# Patient Record
Sex: Male | Born: 1935
Health system: Southern US, Community
[De-identification: ages and names within clinical notes are randomized; demographics above are authoritative.]

## PROBLEM LIST (undated history)

## (undated) DIAGNOSIS — E119 Type 2 diabetes mellitus without complications: Secondary | ICD-10-CM

## (undated) DIAGNOSIS — I350 Nonrheumatic aortic (valve) stenosis: Secondary | ICD-10-CM

## (undated) DIAGNOSIS — I4510 Unspecified right bundle-branch block: Principal | ICD-10-CM

## (undated) DIAGNOSIS — M1990 Unspecified osteoarthritis, unspecified site: Secondary | ICD-10-CM

## (undated) DIAGNOSIS — J189 Pneumonia, unspecified organism: Secondary | ICD-10-CM

## (undated) HISTORY — PX: CATARACT EXTRACTION: SUR2

## (undated) HISTORY — PX: HERNIA REPAIR: SHX51

## (undated) HISTORY — PX: TONSILLECTOMY: SUR1361

## (undated) HISTORY — PX: APPENDECTOMY: SHX54

## (undated) HISTORY — PX: LASIK: SHX215

## (undated) SURGERY — Surgical Case
Anesthesia: *Unknown

---

## 2005-03-25 ENCOUNTER — Ambulatory Visit (HOSPITAL_COMMUNITY): Admission: RE | Admit: 2005-03-25 | Discharge: 2005-03-25 | Payer: Self-pay | Admitting: *Deleted

## 2006-07-13 ENCOUNTER — Encounter: Admission: RE | Admit: 2006-07-13 | Discharge: 2006-07-13 | Payer: Self-pay | Admitting: Internal Medicine

## 2006-09-04 ENCOUNTER — Ambulatory Visit (HOSPITAL_COMMUNITY): Admission: RE | Admit: 2006-09-04 | Discharge: 2006-09-05 | Payer: Self-pay | Admitting: General Surgery

## 2007-05-08 IMAGING — US US SCROTUM
1 series · 14 of 25 positions shown · non-contrast
Comparison: none

CLINICAL DATA: Hydrocele.  
SCROTAL ULTRASOUND:
TECHNIQUE: Complete ultrasound examination of the testicles, epididymis, and other scrotal structures was performed.

[Series 1: unknown · 0.12mm/px · 14 of 61 slices shown]
[im 1/61]
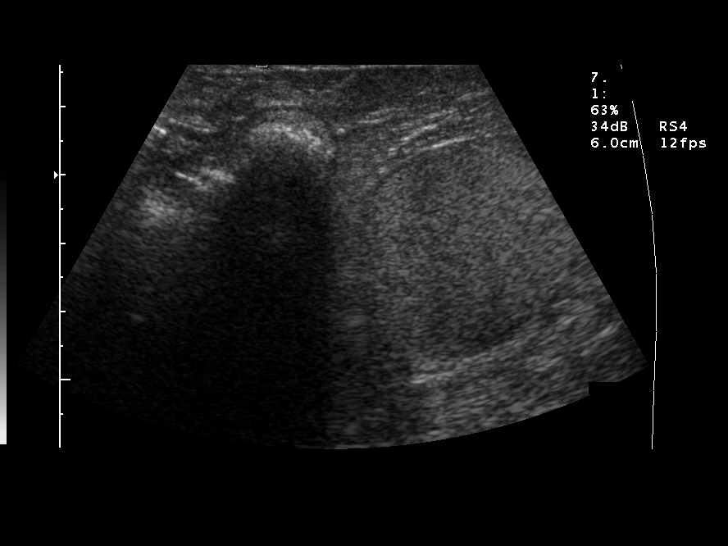
[im 6/61]
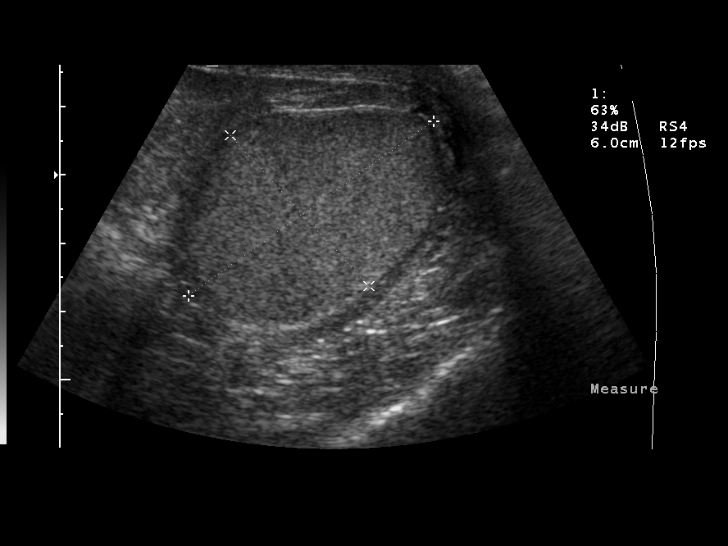
[im 11/61]
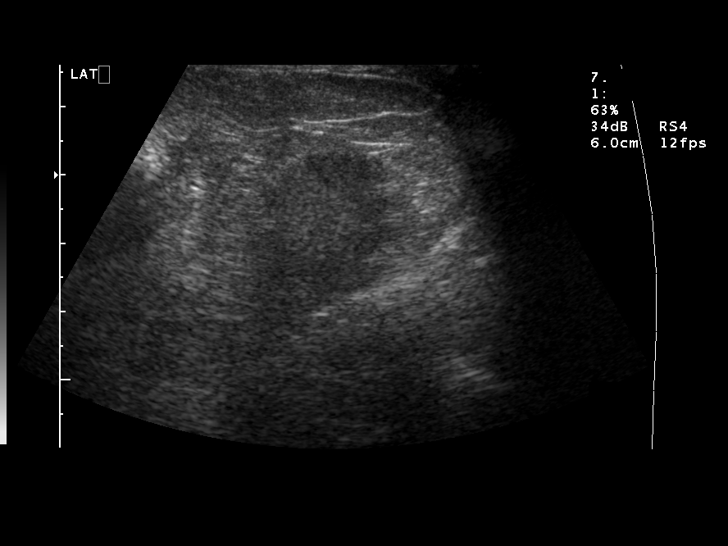
[im 16/61]
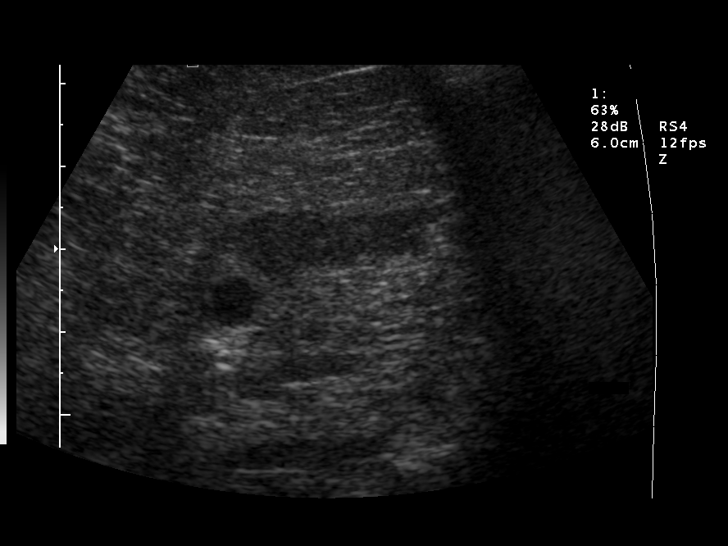
[im 21/61]
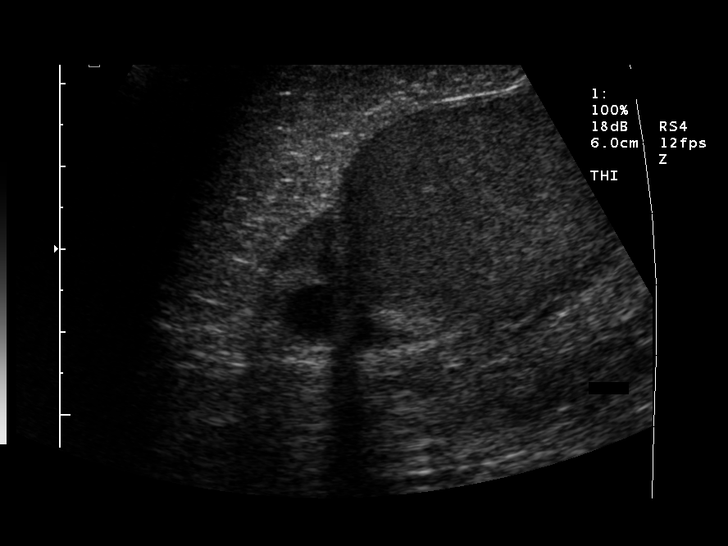
[im 23/61]
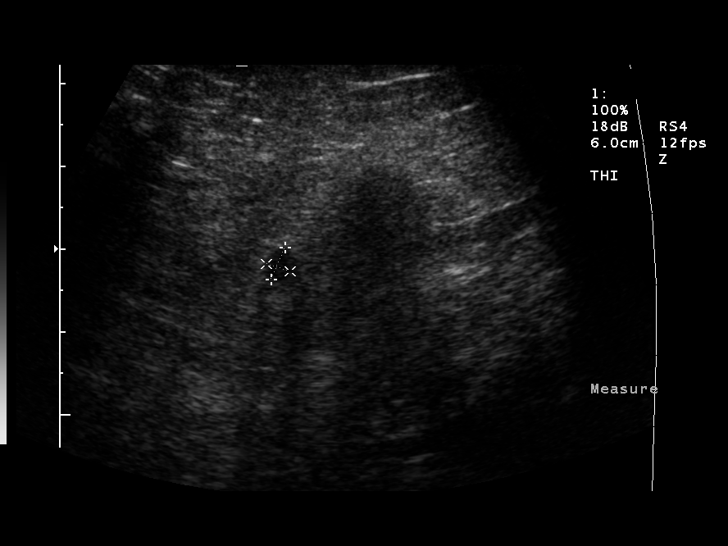
[im 28/61]
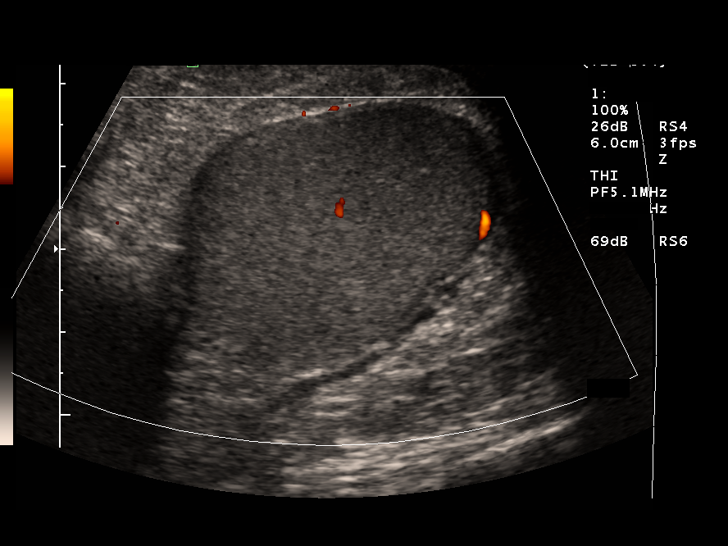
[im 33/61]
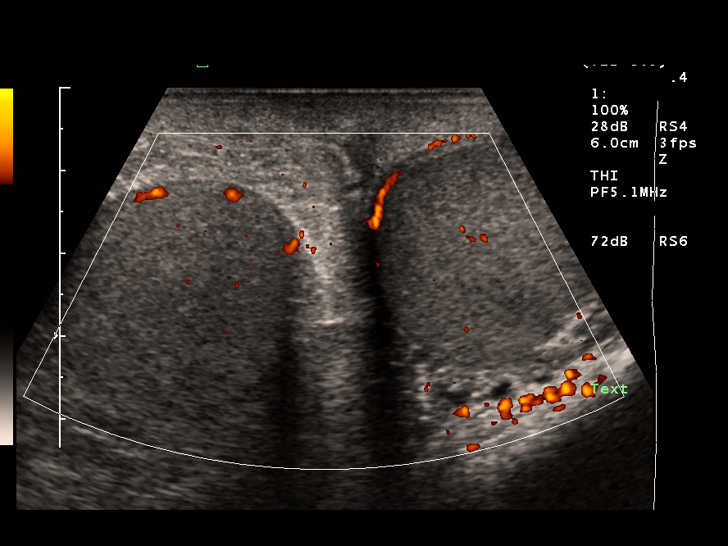
[im 38/61]
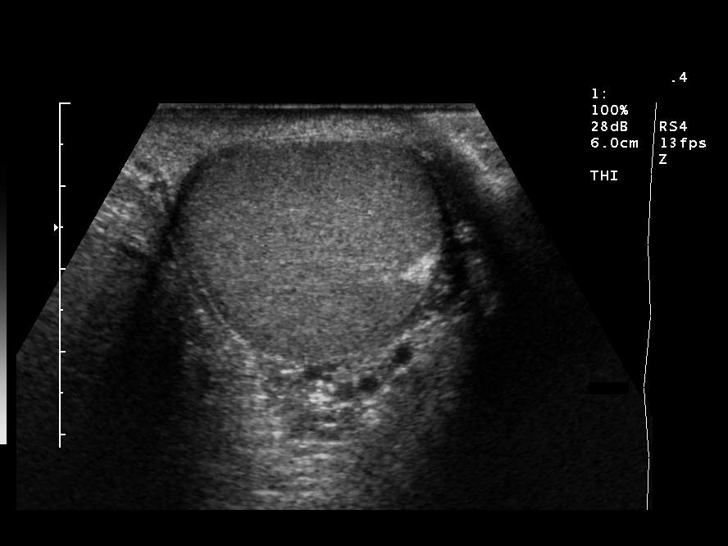
[im 41/61]
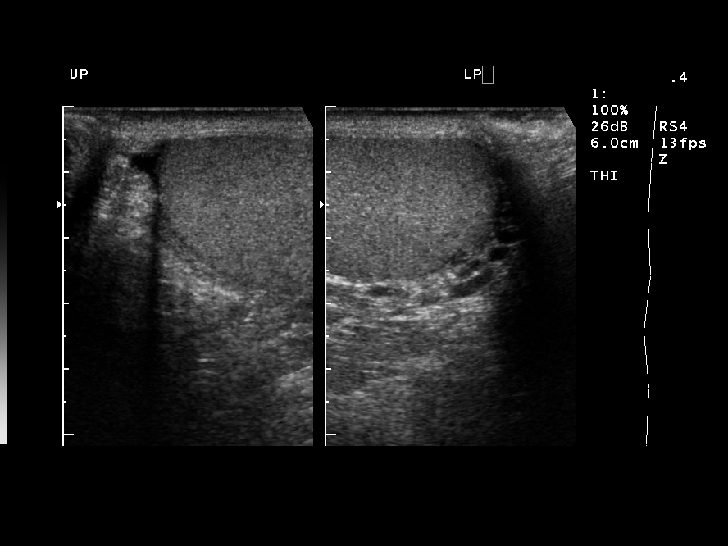
[im 46/61]
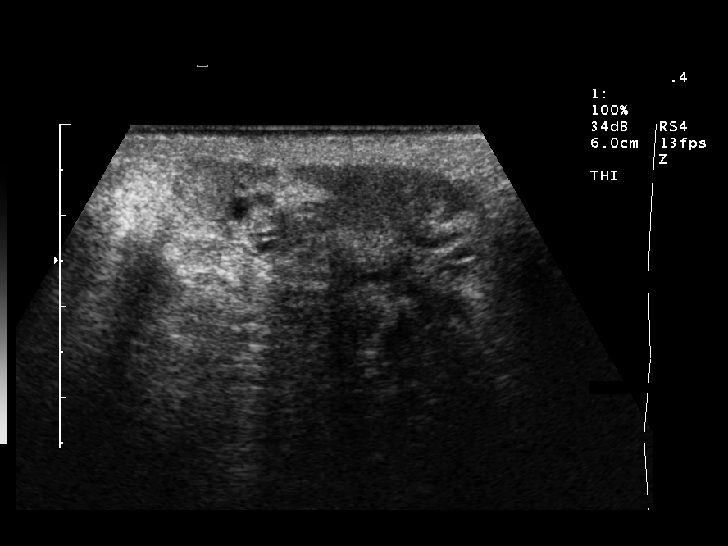
[im 51/61]
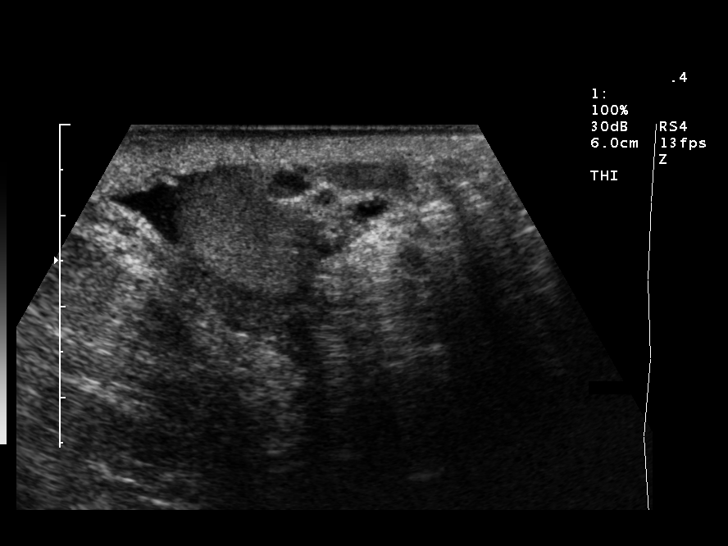
[im 56/61]
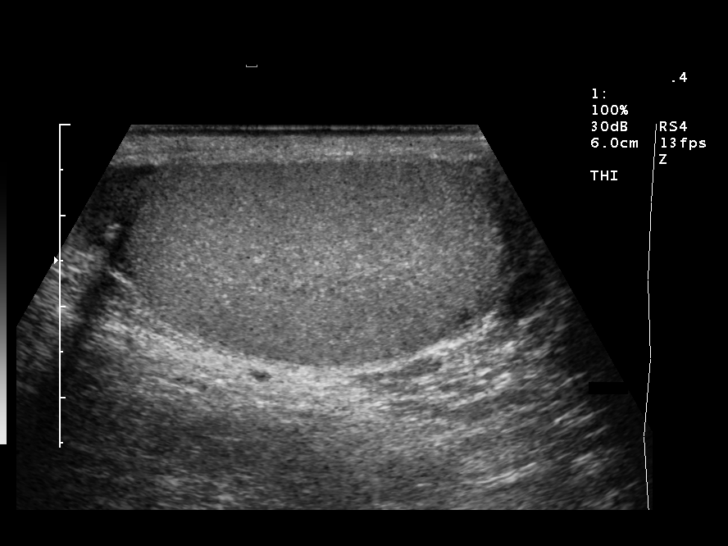
[im 61/61]
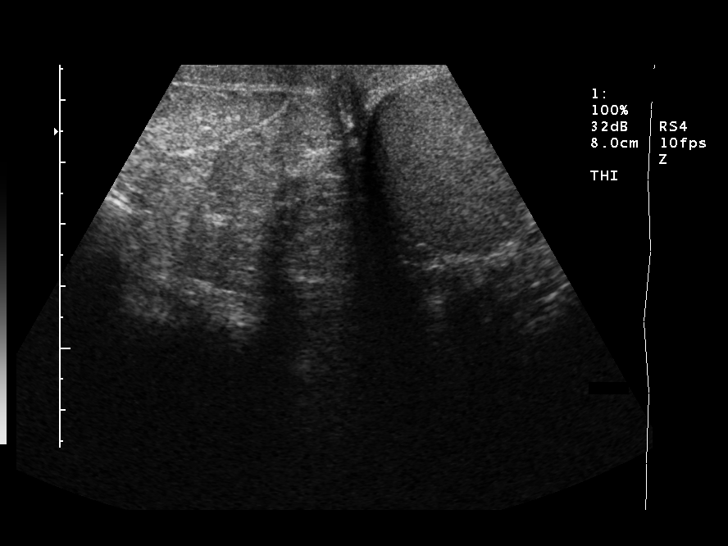

[14 of 25 positions shown; findings below may reference images not displayed]

FINDINGS: The testicles are normal in size and in echogenicity.  There is blood flow to both testicles.  Small epididymal cysts are present bilaterally of no more than 7 mm.  There is a small hydrocele on the left.  No varicocele is seen.  However, there does appear to be echogenicity extending from the inguinal canal toward the right scrotum with peristalsis consistent with herniation of bowel, i.e., right scrotal hernia.
IMPRESSION: 1.  No testicular abnormality.  There is blood flow to both testicles.  
2.  There is, however, bowel extending into the right scrotum consistent with a right scrotal hernia.

## 2011-10-03 ENCOUNTER — Other Ambulatory Visit: Payer: Self-pay | Admitting: Internal Medicine

## 2011-10-03 DIAGNOSIS — R945 Abnormal results of liver function studies: Secondary | ICD-10-CM

## 2011-10-04 ENCOUNTER — Ambulatory Visit
Admission: RE | Admit: 2011-10-04 | Discharge: 2011-10-04 | Disposition: A | Discharge status: Home / Self Care | Payer: Medicare Other | Source: Ambulatory Visit | Attending: Internal Medicine | Admitting: Internal Medicine

## 2011-10-04 DIAGNOSIS — R945 Abnormal results of liver function studies: Secondary | ICD-10-CM

## 2012-07-29 IMAGING — US US ABDOMEN COMPLETE
1 series · 14 of 25 positions shown · non-contrast
Comparison: none

CLINICAL DATA: Abnormal LFTs

COMPLETE ABDOMINAL ULTRASOUND

[Series 1: us abdomen complete · 0.45mm/px · 14 of 74 slices shown]
[im 1/74]
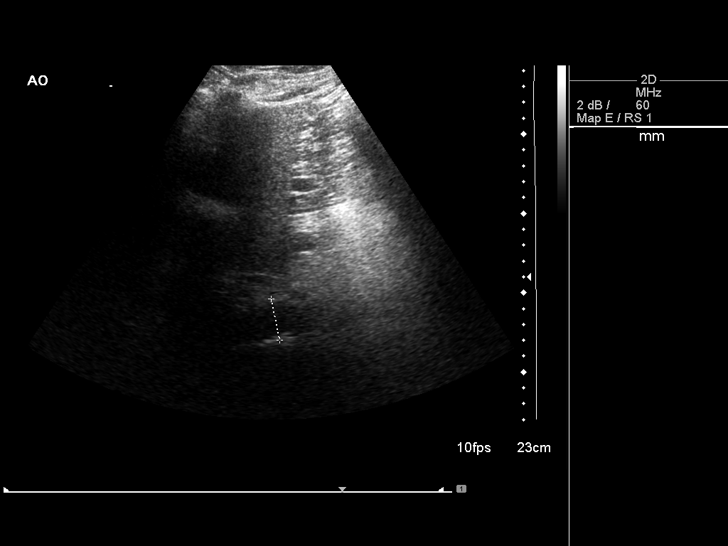
[im 7/74]
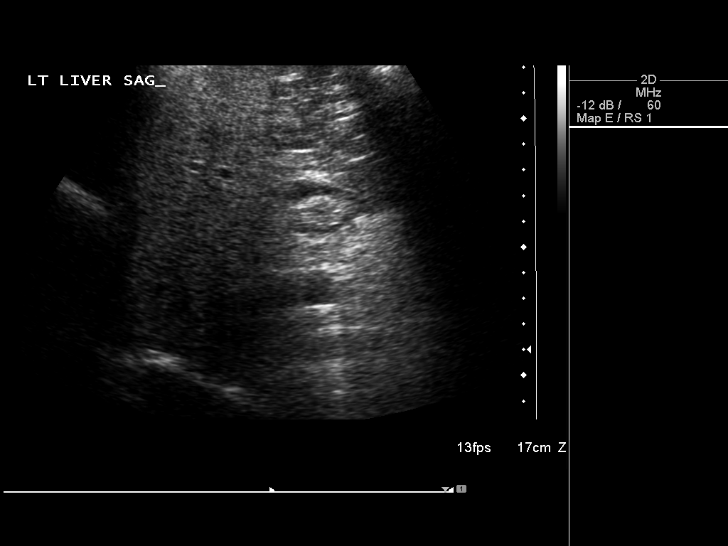
[im 13/74]
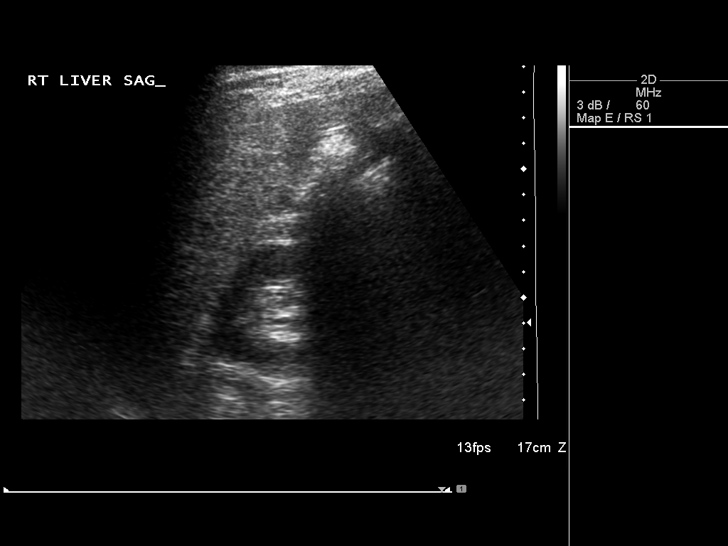
[im 19/74]
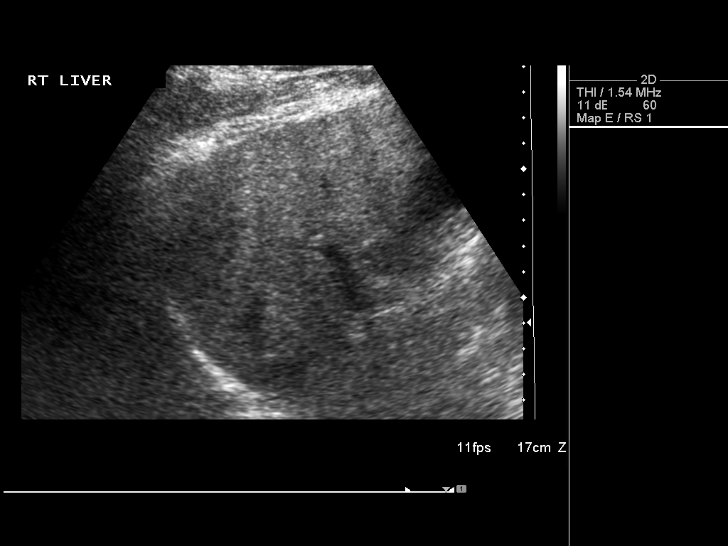
[im 25/74]
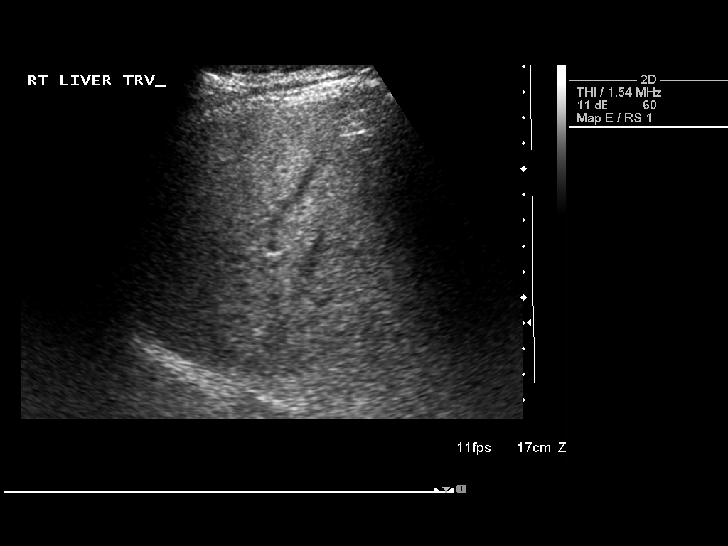
[im 28/74]
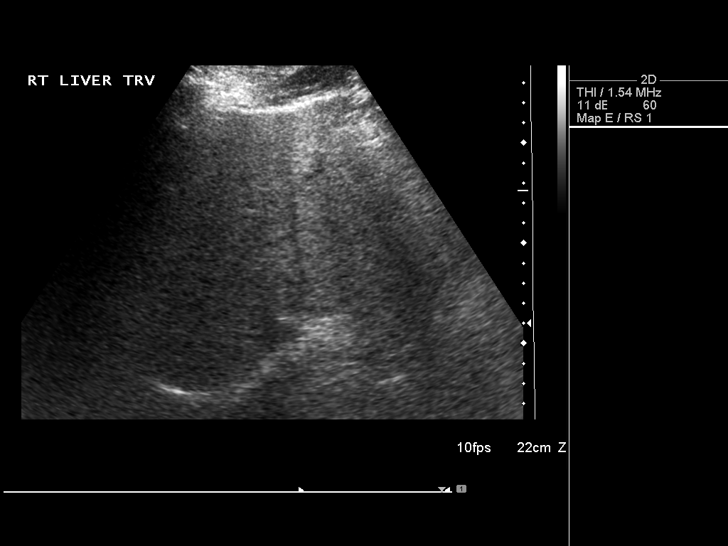
[im 34/74]
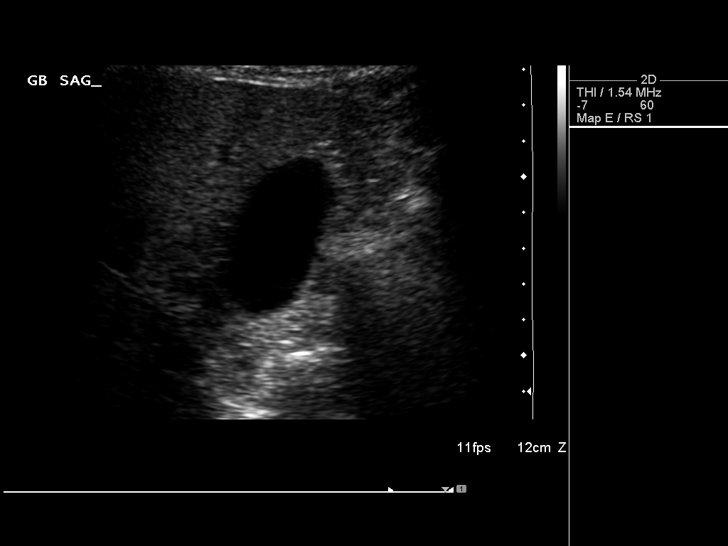
[im 40/74]
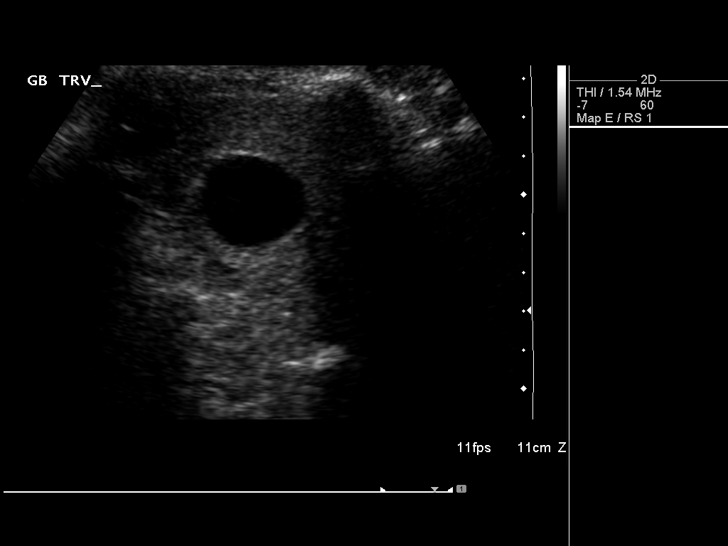
[im 46/74]
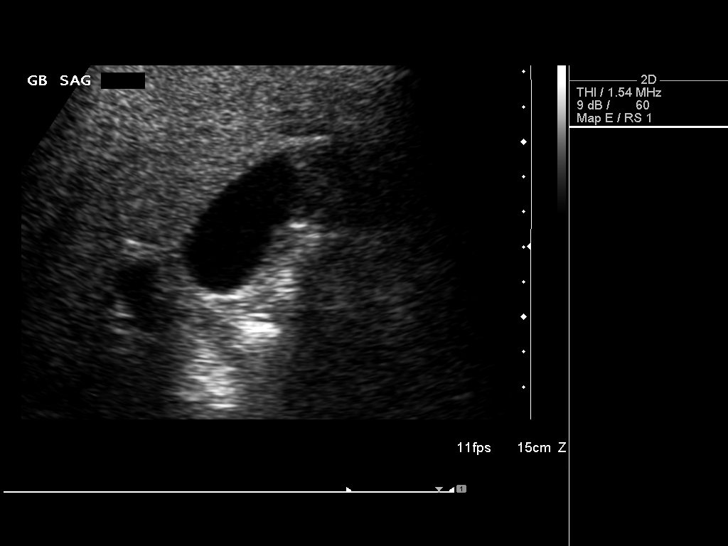
[im 49/74]
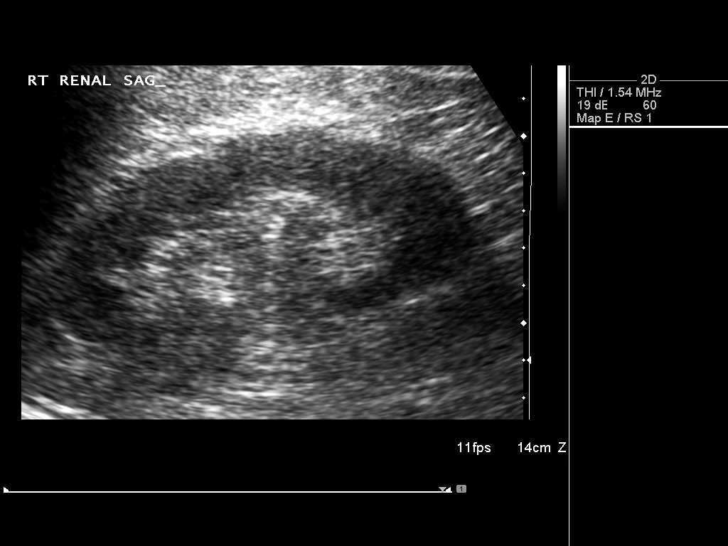
[im 55/74]
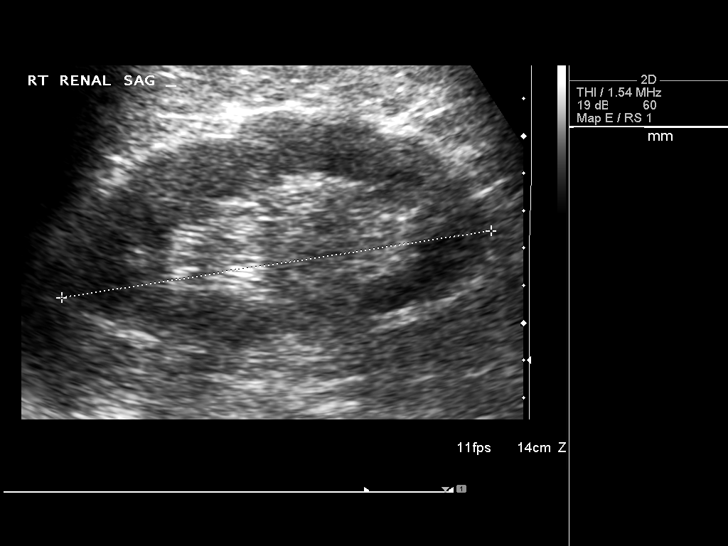
[im 61/74]
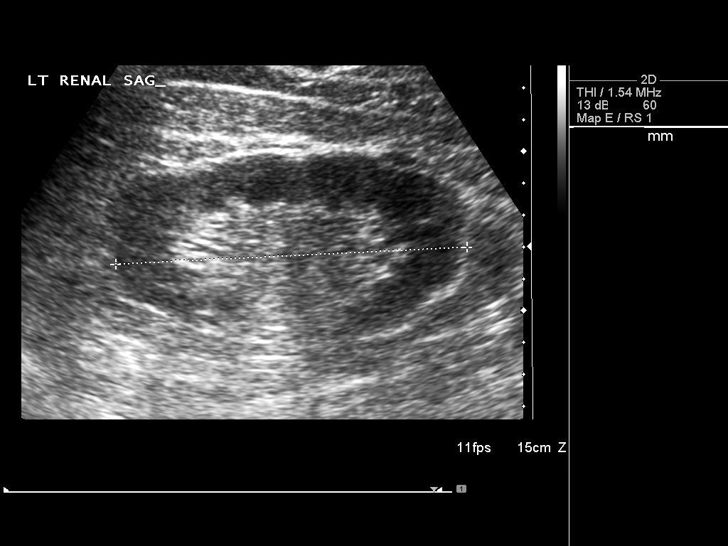
[im 67/74]
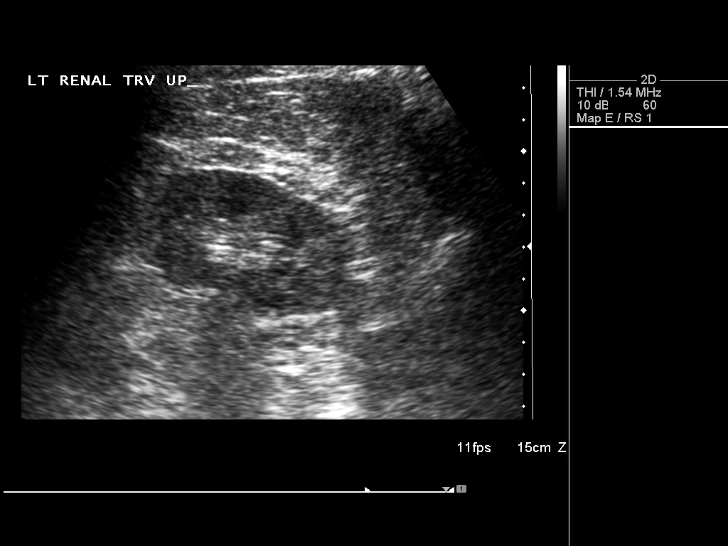
[im 74/74]
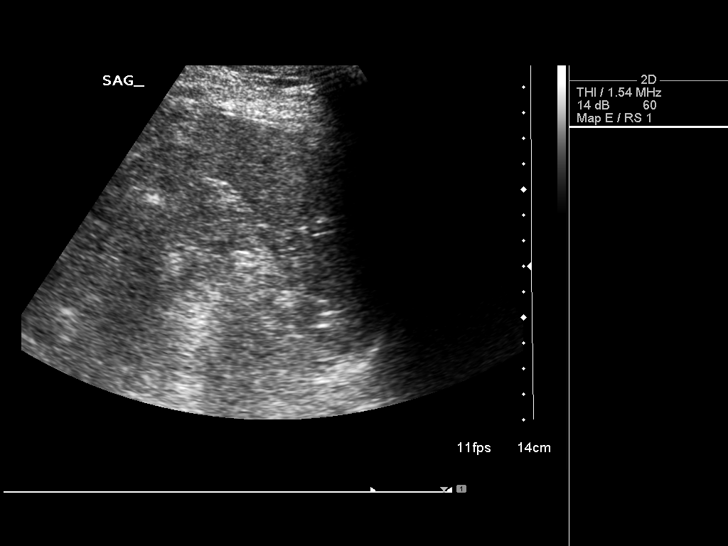

[14 of 25 positions shown; findings below may reference images not displayed]

FINDINGS: Gallbladder:  No gallstones, gallbladder wall thickening, or
pericholecystic fluid.

Common bile duct:  Normal at 4 mm

Liver:  No focal lesion identified.  Within normal limits in
parenchymal echogenicity.

IVC:  Appears normal.

Pancreas:  No focal abnormality seen.

Spleen:  Normal in size and echogenicity.

Right Kidney:  11.6cm in length.  No evidence of hydronephrosis or
stones.

Left Kidney:  12.1cm in length.  No evidence of hydronephrosis or
stones.

Abdominal aorta:  No aneurysm identified.
IMPRESSION: Negative abdominal ultrasound.

## 2013-06-27 ENCOUNTER — Encounter (INDEPENDENT_AMBULATORY_CARE_PROVIDER_SITE_OTHER): Payer: Medicare Other | Admitting: Ophthalmology

## 2013-06-27 DIAGNOSIS — E11319 Type 2 diabetes mellitus with unspecified diabetic retinopathy without macular edema: Secondary | ICD-10-CM

## 2013-06-27 DIAGNOSIS — E1165 Type 2 diabetes mellitus with hyperglycemia: Secondary | ICD-10-CM

## 2013-06-27 DIAGNOSIS — H2510 Age-related nuclear cataract, unspecified eye: Secondary | ICD-10-CM

## 2013-06-27 DIAGNOSIS — H43819 Vitreous degeneration, unspecified eye: Secondary | ICD-10-CM

## 2013-06-27 DIAGNOSIS — H348192 Central retinal vein occlusion, unspecified eye, stable: Secondary | ICD-10-CM

## 2013-07-29 ENCOUNTER — Encounter (INDEPENDENT_AMBULATORY_CARE_PROVIDER_SITE_OTHER): Payer: Medicare Other | Admitting: Ophthalmology

## 2013-07-31 ENCOUNTER — Encounter (INDEPENDENT_AMBULATORY_CARE_PROVIDER_SITE_OTHER): Payer: Medicare Other | Admitting: Ophthalmology

## 2013-07-31 DIAGNOSIS — H43819 Vitreous degeneration, unspecified eye: Secondary | ICD-10-CM

## 2013-07-31 DIAGNOSIS — H348192 Central retinal vein occlusion, unspecified eye, stable: Secondary | ICD-10-CM

## 2013-07-31 DIAGNOSIS — H2510 Age-related nuclear cataract, unspecified eye: Secondary | ICD-10-CM

## 2013-07-31 DIAGNOSIS — E11319 Type 2 diabetes mellitus with unspecified diabetic retinopathy without macular edema: Secondary | ICD-10-CM

## 2013-07-31 DIAGNOSIS — E1139 Type 2 diabetes mellitus with other diabetic ophthalmic complication: Secondary | ICD-10-CM

## 2013-08-28 ENCOUNTER — Encounter (INDEPENDENT_AMBULATORY_CARE_PROVIDER_SITE_OTHER): Payer: Medicare Other | Admitting: Ophthalmology

## 2013-08-28 DIAGNOSIS — E1139 Type 2 diabetes mellitus with other diabetic ophthalmic complication: Secondary | ICD-10-CM

## 2013-08-28 DIAGNOSIS — H348192 Central retinal vein occlusion, unspecified eye, stable: Secondary | ICD-10-CM

## 2013-08-28 DIAGNOSIS — H2510 Age-related nuclear cataract, unspecified eye: Secondary | ICD-10-CM

## 2013-08-28 DIAGNOSIS — E11319 Type 2 diabetes mellitus with unspecified diabetic retinopathy without macular edema: Secondary | ICD-10-CM

## 2013-08-28 DIAGNOSIS — H43819 Vitreous degeneration, unspecified eye: Secondary | ICD-10-CM

## 2013-09-25 ENCOUNTER — Encounter (INDEPENDENT_AMBULATORY_CARE_PROVIDER_SITE_OTHER): Payer: Medicare Other | Admitting: Ophthalmology

## 2013-09-25 DIAGNOSIS — H43819 Vitreous degeneration, unspecified eye: Secondary | ICD-10-CM

## 2013-09-25 DIAGNOSIS — H2510 Age-related nuclear cataract, unspecified eye: Secondary | ICD-10-CM

## 2013-09-25 DIAGNOSIS — E11319 Type 2 diabetes mellitus with unspecified diabetic retinopathy without macular edema: Secondary | ICD-10-CM

## 2013-09-25 DIAGNOSIS — H348192 Central retinal vein occlusion, unspecified eye, stable: Secondary | ICD-10-CM

## 2013-09-25 DIAGNOSIS — E1139 Type 2 diabetes mellitus with other diabetic ophthalmic complication: Secondary | ICD-10-CM

## 2013-10-23 ENCOUNTER — Encounter (INDEPENDENT_AMBULATORY_CARE_PROVIDER_SITE_OTHER): Payer: Medicare Other | Admitting: Ophthalmology

## 2013-10-23 DIAGNOSIS — H43819 Vitreous degeneration, unspecified eye: Secondary | ICD-10-CM

## 2013-10-23 DIAGNOSIS — E11319 Type 2 diabetes mellitus with unspecified diabetic retinopathy without macular edema: Secondary | ICD-10-CM

## 2013-10-23 DIAGNOSIS — H2510 Age-related nuclear cataract, unspecified eye: Secondary | ICD-10-CM

## 2013-10-23 DIAGNOSIS — H348192 Central retinal vein occlusion, unspecified eye, stable: Secondary | ICD-10-CM

## 2013-10-23 DIAGNOSIS — E1139 Type 2 diabetes mellitus with other diabetic ophthalmic complication: Secondary | ICD-10-CM

## 2013-10-23 DIAGNOSIS — I10 Essential (primary) hypertension: Secondary | ICD-10-CM

## 2013-10-23 DIAGNOSIS — H35039 Hypertensive retinopathy, unspecified eye: Secondary | ICD-10-CM

## 2013-11-20 ENCOUNTER — Encounter (INDEPENDENT_AMBULATORY_CARE_PROVIDER_SITE_OTHER): Payer: Medicare Other | Admitting: Ophthalmology

## 2013-11-20 DIAGNOSIS — H43819 Vitreous degeneration, unspecified eye: Secondary | ICD-10-CM

## 2013-11-20 DIAGNOSIS — E11319 Type 2 diabetes mellitus with unspecified diabetic retinopathy without macular edema: Secondary | ICD-10-CM

## 2013-11-20 DIAGNOSIS — H35039 Hypertensive retinopathy, unspecified eye: Secondary | ICD-10-CM

## 2013-11-20 DIAGNOSIS — E1139 Type 2 diabetes mellitus with other diabetic ophthalmic complication: Secondary | ICD-10-CM

## 2013-11-20 DIAGNOSIS — I10 Essential (primary) hypertension: Secondary | ICD-10-CM

## 2013-11-20 DIAGNOSIS — H2510 Age-related nuclear cataract, unspecified eye: Secondary | ICD-10-CM

## 2013-11-20 DIAGNOSIS — H348192 Central retinal vein occlusion, unspecified eye, stable: Secondary | ICD-10-CM

## 2013-12-25 ENCOUNTER — Encounter (INDEPENDENT_AMBULATORY_CARE_PROVIDER_SITE_OTHER): Payer: Medicare Other | Admitting: Ophthalmology

## 2013-12-25 DIAGNOSIS — E11319 Type 2 diabetes mellitus with unspecified diabetic retinopathy without macular edema: Secondary | ICD-10-CM

## 2013-12-25 DIAGNOSIS — I10 Essential (primary) hypertension: Secondary | ICD-10-CM

## 2013-12-25 DIAGNOSIS — E1165 Type 2 diabetes mellitus with hyperglycemia: Secondary | ICD-10-CM

## 2013-12-25 DIAGNOSIS — H43819 Vitreous degeneration, unspecified eye: Secondary | ICD-10-CM

## 2013-12-25 DIAGNOSIS — H348192 Central retinal vein occlusion, unspecified eye, stable: Secondary | ICD-10-CM

## 2013-12-25 DIAGNOSIS — H35039 Hypertensive retinopathy, unspecified eye: Secondary | ICD-10-CM

## 2013-12-25 DIAGNOSIS — H2510 Age-related nuclear cataract, unspecified eye: Secondary | ICD-10-CM

## 2013-12-25 DIAGNOSIS — E1139 Type 2 diabetes mellitus with other diabetic ophthalmic complication: Secondary | ICD-10-CM

## 2014-02-27 ENCOUNTER — Encounter (INDEPENDENT_AMBULATORY_CARE_PROVIDER_SITE_OTHER): Payer: Medicare Other | Admitting: Ophthalmology

## 2014-03-05 ENCOUNTER — Encounter (INDEPENDENT_AMBULATORY_CARE_PROVIDER_SITE_OTHER): Payer: Medicare Other | Admitting: Ophthalmology

## 2014-03-28 ENCOUNTER — Encounter (INDEPENDENT_AMBULATORY_CARE_PROVIDER_SITE_OTHER): Payer: Medicare Other | Admitting: Ophthalmology

## 2014-04-04 ENCOUNTER — Encounter (INDEPENDENT_AMBULATORY_CARE_PROVIDER_SITE_OTHER): Payer: Medicare Other | Admitting: Ophthalmology

## 2014-04-04 DIAGNOSIS — H43819 Vitreous degeneration, unspecified eye: Secondary | ICD-10-CM

## 2014-04-04 DIAGNOSIS — I10 Essential (primary) hypertension: Secondary | ICD-10-CM

## 2014-04-04 DIAGNOSIS — E1165 Type 2 diabetes mellitus with hyperglycemia: Secondary | ICD-10-CM

## 2014-04-04 DIAGNOSIS — E11319 Type 2 diabetes mellitus with unspecified diabetic retinopathy without macular edema: Secondary | ICD-10-CM

## 2014-04-04 DIAGNOSIS — H35039 Hypertensive retinopathy, unspecified eye: Secondary | ICD-10-CM

## 2014-04-04 DIAGNOSIS — H2510 Age-related nuclear cataract, unspecified eye: Secondary | ICD-10-CM

## 2014-04-04 DIAGNOSIS — H348192 Central retinal vein occlusion, unspecified eye, stable: Secondary | ICD-10-CM

## 2014-04-04 DIAGNOSIS — E1139 Type 2 diabetes mellitus with other diabetic ophthalmic complication: Secondary | ICD-10-CM

## 2015-08-03 ENCOUNTER — Encounter (INDEPENDENT_AMBULATORY_CARE_PROVIDER_SITE_OTHER): Payer: PPO | Admitting: Ophthalmology

## 2015-08-03 DIAGNOSIS — E11319 Type 2 diabetes mellitus with unspecified diabetic retinopathy without macular edema: Secondary | ICD-10-CM

## 2015-08-03 DIAGNOSIS — H43813 Vitreous degeneration, bilateral: Secondary | ICD-10-CM

## 2015-08-03 DIAGNOSIS — H34812 Central retinal vein occlusion, left eye: Secondary | ICD-10-CM

## 2015-08-03 DIAGNOSIS — I10 Essential (primary) hypertension: Secondary | ICD-10-CM

## 2015-08-03 DIAGNOSIS — E11329 Type 2 diabetes mellitus with mild nonproliferative diabetic retinopathy without macular edema: Secondary | ICD-10-CM | POA: Diagnosis not present

## 2015-08-03 DIAGNOSIS — H35033 Hypertensive retinopathy, bilateral: Secondary | ICD-10-CM | POA: Diagnosis not present

## 2015-09-02 ENCOUNTER — Encounter (INDEPENDENT_AMBULATORY_CARE_PROVIDER_SITE_OTHER): Payer: PPO | Admitting: Ophthalmology

## 2015-09-02 DIAGNOSIS — E11329 Type 2 diabetes mellitus with mild nonproliferative diabetic retinopathy without macular edema: Secondary | ICD-10-CM | POA: Diagnosis not present

## 2015-09-02 DIAGNOSIS — I10 Essential (primary) hypertension: Secondary | ICD-10-CM

## 2015-09-02 DIAGNOSIS — E11319 Type 2 diabetes mellitus with unspecified diabetic retinopathy without macular edema: Secondary | ICD-10-CM

## 2015-09-02 DIAGNOSIS — H34812 Central retinal vein occlusion, left eye: Secondary | ICD-10-CM | POA: Diagnosis not present

## 2015-09-02 DIAGNOSIS — H35033 Hypertensive retinopathy, bilateral: Secondary | ICD-10-CM

## 2015-09-02 DIAGNOSIS — H43813 Vitreous degeneration, bilateral: Secondary | ICD-10-CM | POA: Diagnosis not present

## 2015-09-30 ENCOUNTER — Encounter (INDEPENDENT_AMBULATORY_CARE_PROVIDER_SITE_OTHER): Payer: PPO | Admitting: Ophthalmology

## 2015-09-30 DIAGNOSIS — H34812 Central retinal vein occlusion, left eye, with macular edema: Secondary | ICD-10-CM | POA: Diagnosis not present

## 2015-09-30 DIAGNOSIS — E113312 Type 2 diabetes mellitus with moderate nonproliferative diabetic retinopathy with macular edema, left eye: Secondary | ICD-10-CM | POA: Diagnosis not present

## 2015-09-30 DIAGNOSIS — E113291 Type 2 diabetes mellitus with mild nonproliferative diabetic retinopathy without macular edema, right eye: Secondary | ICD-10-CM

## 2015-09-30 DIAGNOSIS — E11311 Type 2 diabetes mellitus with unspecified diabetic retinopathy with macular edema: Secondary | ICD-10-CM

## 2015-09-30 DIAGNOSIS — H43813 Vitreous degeneration, bilateral: Secondary | ICD-10-CM

## 2015-10-27 ENCOUNTER — Encounter (INDEPENDENT_AMBULATORY_CARE_PROVIDER_SITE_OTHER): Payer: PPO | Admitting: Ophthalmology

## 2015-10-27 DIAGNOSIS — E113312 Type 2 diabetes mellitus with moderate nonproliferative diabetic retinopathy with macular edema, left eye: Secondary | ICD-10-CM

## 2015-10-27 DIAGNOSIS — H34812 Central retinal vein occlusion, left eye, with macular edema: Secondary | ICD-10-CM

## 2015-10-27 DIAGNOSIS — E113291 Type 2 diabetes mellitus with mild nonproliferative diabetic retinopathy without macular edema, right eye: Secondary | ICD-10-CM | POA: Diagnosis not present

## 2015-10-27 DIAGNOSIS — H43813 Vitreous degeneration, bilateral: Secondary | ICD-10-CM | POA: Diagnosis not present

## 2015-10-27 DIAGNOSIS — E11311 Type 2 diabetes mellitus with unspecified diabetic retinopathy with macular edema: Secondary | ICD-10-CM

## 2015-12-09 ENCOUNTER — Encounter (INDEPENDENT_AMBULATORY_CARE_PROVIDER_SITE_OTHER): Payer: PPO | Admitting: Ophthalmology

## 2015-12-11 ENCOUNTER — Encounter (INDEPENDENT_AMBULATORY_CARE_PROVIDER_SITE_OTHER): Payer: PPO | Admitting: Ophthalmology

## 2015-12-11 DIAGNOSIS — E113312 Type 2 diabetes mellitus with moderate nonproliferative diabetic retinopathy with macular edema, left eye: Secondary | ICD-10-CM | POA: Diagnosis not present

## 2015-12-11 DIAGNOSIS — E113291 Type 2 diabetes mellitus with mild nonproliferative diabetic retinopathy without macular edema, right eye: Secondary | ICD-10-CM | POA: Diagnosis not present

## 2015-12-11 DIAGNOSIS — H34812 Central retinal vein occlusion, left eye, with macular edema: Secondary | ICD-10-CM

## 2015-12-11 DIAGNOSIS — H2511 Age-related nuclear cataract, right eye: Secondary | ICD-10-CM | POA: Diagnosis not present

## 2015-12-11 DIAGNOSIS — H43813 Vitreous degeneration, bilateral: Secondary | ICD-10-CM

## 2015-12-11 DIAGNOSIS — I10 Essential (primary) hypertension: Secondary | ICD-10-CM

## 2015-12-11 DIAGNOSIS — H35033 Hypertensive retinopathy, bilateral: Secondary | ICD-10-CM | POA: Diagnosis not present

## 2015-12-11 DIAGNOSIS — E11311 Type 2 diabetes mellitus with unspecified diabetic retinopathy with macular edema: Secondary | ICD-10-CM | POA: Diagnosis not present

## 2015-12-16 DIAGNOSIS — E119 Type 2 diabetes mellitus without complications: Secondary | ICD-10-CM | POA: Diagnosis not present

## 2015-12-16 DIAGNOSIS — Z7984 Long term (current) use of oral hypoglycemic drugs: Secondary | ICD-10-CM | POA: Diagnosis not present

## 2015-12-21 DIAGNOSIS — E119 Type 2 diabetes mellitus without complications: Secondary | ICD-10-CM | POA: Diagnosis not present

## 2015-12-21 DIAGNOSIS — I10 Essential (primary) hypertension: Secondary | ICD-10-CM | POA: Diagnosis not present

## 2015-12-24 DIAGNOSIS — E7800 Pure hypercholesterolemia, unspecified: Secondary | ICD-10-CM | POA: Diagnosis not present

## 2015-12-24 DIAGNOSIS — E119 Type 2 diabetes mellitus without complications: Secondary | ICD-10-CM | POA: Diagnosis not present

## 2015-12-24 DIAGNOSIS — R011 Cardiac murmur, unspecified: Secondary | ICD-10-CM | POA: Diagnosis not present

## 2016-01-22 ENCOUNTER — Encounter (INDEPENDENT_AMBULATORY_CARE_PROVIDER_SITE_OTHER): Payer: PPO | Admitting: Ophthalmology

## 2016-01-22 DIAGNOSIS — H35033 Hypertensive retinopathy, bilateral: Secondary | ICD-10-CM | POA: Diagnosis not present

## 2016-01-22 DIAGNOSIS — H348122 Central retinal vein occlusion, left eye, stable: Secondary | ICD-10-CM

## 2016-01-22 DIAGNOSIS — H43813 Vitreous degeneration, bilateral: Secondary | ICD-10-CM | POA: Diagnosis not present

## 2016-01-22 DIAGNOSIS — H2511 Age-related nuclear cataract, right eye: Secondary | ICD-10-CM

## 2016-01-22 DIAGNOSIS — E113392 Type 2 diabetes mellitus with moderate nonproliferative diabetic retinopathy without macular edema, left eye: Secondary | ICD-10-CM | POA: Diagnosis not present

## 2016-01-22 DIAGNOSIS — E11319 Type 2 diabetes mellitus with unspecified diabetic retinopathy without macular edema: Secondary | ICD-10-CM | POA: Diagnosis not present

## 2016-01-22 DIAGNOSIS — E113291 Type 2 diabetes mellitus with mild nonproliferative diabetic retinopathy without macular edema, right eye: Secondary | ICD-10-CM

## 2016-01-22 DIAGNOSIS — I10 Essential (primary) hypertension: Secondary | ICD-10-CM | POA: Diagnosis not present

## 2016-03-04 ENCOUNTER — Encounter (INDEPENDENT_AMBULATORY_CARE_PROVIDER_SITE_OTHER): Payer: PPO | Admitting: Ophthalmology

## 2016-03-04 DIAGNOSIS — E11319 Type 2 diabetes mellitus with unspecified diabetic retinopathy without macular edema: Secondary | ICD-10-CM | POA: Diagnosis not present

## 2016-03-04 DIAGNOSIS — H43813 Vitreous degeneration, bilateral: Secondary | ICD-10-CM | POA: Diagnosis not present

## 2016-03-04 DIAGNOSIS — H34813 Central retinal vein occlusion, bilateral, with macular edema: Secondary | ICD-10-CM | POA: Diagnosis not present

## 2016-03-04 DIAGNOSIS — H35033 Hypertensive retinopathy, bilateral: Secondary | ICD-10-CM

## 2016-03-04 DIAGNOSIS — I10 Essential (primary) hypertension: Secondary | ICD-10-CM

## 2016-03-04 DIAGNOSIS — E113291 Type 2 diabetes mellitus with mild nonproliferative diabetic retinopathy without macular edema, right eye: Secondary | ICD-10-CM | POA: Diagnosis not present

## 2016-03-25 DIAGNOSIS — E119 Type 2 diabetes mellitus without complications: Secondary | ICD-10-CM | POA: Diagnosis not present

## 2016-03-25 DIAGNOSIS — E7800 Pure hypercholesterolemia, unspecified: Secondary | ICD-10-CM | POA: Diagnosis not present

## 2016-03-31 DIAGNOSIS — E7800 Pure hypercholesterolemia, unspecified: Secondary | ICD-10-CM | POA: Diagnosis not present

## 2016-03-31 DIAGNOSIS — E119 Type 2 diabetes mellitus without complications: Secondary | ICD-10-CM | POA: Diagnosis not present

## 2016-03-31 DIAGNOSIS — K7689 Other specified diseases of liver: Secondary | ICD-10-CM | POA: Diagnosis not present

## 2016-03-31 DIAGNOSIS — I10 Essential (primary) hypertension: Secondary | ICD-10-CM | POA: Diagnosis not present

## 2016-04-04 ENCOUNTER — Other Ambulatory Visit: Payer: Self-pay | Admitting: Internal Medicine

## 2016-04-04 DIAGNOSIS — R7989 Other specified abnormal findings of blood chemistry: Secondary | ICD-10-CM

## 2016-04-04 DIAGNOSIS — R945 Abnormal results of liver function studies: Secondary | ICD-10-CM

## 2016-04-12 ENCOUNTER — Ambulatory Visit
Admission: RE | Admit: 2016-04-12 | Discharge: 2016-04-12 | Disposition: A | Discharge status: Home / Self Care | Payer: PPO | Source: Ambulatory Visit | Attending: Internal Medicine | Admitting: Internal Medicine

## 2016-04-12 DIAGNOSIS — R7989 Other specified abnormal findings of blood chemistry: Secondary | ICD-10-CM | POA: Diagnosis not present

## 2016-04-12 DIAGNOSIS — R945 Abnormal results of liver function studies: Secondary | ICD-10-CM

## 2016-04-15 ENCOUNTER — Encounter (INDEPENDENT_AMBULATORY_CARE_PROVIDER_SITE_OTHER): Payer: PPO | Admitting: Ophthalmology

## 2016-04-15 DIAGNOSIS — E113291 Type 2 diabetes mellitus with mild nonproliferative diabetic retinopathy without macular edema, right eye: Secondary | ICD-10-CM | POA: Diagnosis not present

## 2016-04-15 DIAGNOSIS — H34812 Central retinal vein occlusion, left eye, with macular edema: Secondary | ICD-10-CM

## 2016-04-15 DIAGNOSIS — H2511 Age-related nuclear cataract, right eye: Secondary | ICD-10-CM

## 2016-04-15 DIAGNOSIS — H35033 Hypertensive retinopathy, bilateral: Secondary | ICD-10-CM | POA: Diagnosis not present

## 2016-04-15 DIAGNOSIS — H43813 Vitreous degeneration, bilateral: Secondary | ICD-10-CM | POA: Diagnosis not present

## 2016-04-15 DIAGNOSIS — E11311 Type 2 diabetes mellitus with unspecified diabetic retinopathy with macular edema: Secondary | ICD-10-CM

## 2016-04-15 DIAGNOSIS — E113312 Type 2 diabetes mellitus with moderate nonproliferative diabetic retinopathy with macular edema, left eye: Secondary | ICD-10-CM

## 2016-04-15 DIAGNOSIS — I10 Essential (primary) hypertension: Secondary | ICD-10-CM

## 2016-06-10 ENCOUNTER — Encounter (INDEPENDENT_AMBULATORY_CARE_PROVIDER_SITE_OTHER): Payer: PPO | Admitting: Ophthalmology

## 2016-06-10 DIAGNOSIS — E113312 Type 2 diabetes mellitus with moderate nonproliferative diabetic retinopathy with macular edema, left eye: Secondary | ICD-10-CM

## 2016-06-10 DIAGNOSIS — I10 Essential (primary) hypertension: Secondary | ICD-10-CM | POA: Diagnosis not present

## 2016-06-10 DIAGNOSIS — E11311 Type 2 diabetes mellitus with unspecified diabetic retinopathy with macular edema: Secondary | ICD-10-CM

## 2016-06-10 DIAGNOSIS — H43813 Vitreous degeneration, bilateral: Secondary | ICD-10-CM

## 2016-06-10 DIAGNOSIS — E113391 Type 2 diabetes mellitus with moderate nonproliferative diabetic retinopathy without macular edema, right eye: Secondary | ICD-10-CM | POA: Diagnosis not present

## 2016-06-10 DIAGNOSIS — H35033 Hypertensive retinopathy, bilateral: Secondary | ICD-10-CM

## 2016-06-10 DIAGNOSIS — H34812 Central retinal vein occlusion, left eye, with macular edema: Secondary | ICD-10-CM

## 2016-07-07 DIAGNOSIS — I10 Essential (primary) hypertension: Secondary | ICD-10-CM | POA: Diagnosis not present

## 2016-07-07 DIAGNOSIS — K7689 Other specified diseases of liver: Secondary | ICD-10-CM | POA: Diagnosis not present

## 2016-07-07 DIAGNOSIS — E119 Type 2 diabetes mellitus without complications: Secondary | ICD-10-CM | POA: Diagnosis not present

## 2016-07-11 DIAGNOSIS — L821 Other seborrheic keratosis: Secondary | ICD-10-CM | POA: Diagnosis not present

## 2016-07-11 DIAGNOSIS — Z125 Encounter for screening for malignant neoplasm of prostate: Secondary | ICD-10-CM | POA: Diagnosis not present

## 2016-07-11 DIAGNOSIS — E119 Type 2 diabetes mellitus without complications: Secondary | ICD-10-CM | POA: Diagnosis not present

## 2016-07-11 DIAGNOSIS — W57XXXA Bitten or stung by nonvenomous insect and other nonvenomous arthropods, initial encounter: Secondary | ICD-10-CM | POA: Diagnosis not present

## 2016-08-04 ENCOUNTER — Encounter (INDEPENDENT_AMBULATORY_CARE_PROVIDER_SITE_OTHER): Payer: PPO | Admitting: Ophthalmology

## 2016-08-04 DIAGNOSIS — H43813 Vitreous degeneration, bilateral: Secondary | ICD-10-CM | POA: Diagnosis not present

## 2016-08-04 DIAGNOSIS — E113312 Type 2 diabetes mellitus with moderate nonproliferative diabetic retinopathy with macular edema, left eye: Secondary | ICD-10-CM | POA: Diagnosis not present

## 2016-08-04 DIAGNOSIS — E113291 Type 2 diabetes mellitus with mild nonproliferative diabetic retinopathy without macular edema, right eye: Secondary | ICD-10-CM | POA: Diagnosis not present

## 2016-08-04 DIAGNOSIS — E11311 Type 2 diabetes mellitus with unspecified diabetic retinopathy with macular edema: Secondary | ICD-10-CM

## 2016-08-04 DIAGNOSIS — H2511 Age-related nuclear cataract, right eye: Secondary | ICD-10-CM

## 2016-08-04 DIAGNOSIS — H34812 Central retinal vein occlusion, left eye, with macular edema: Secondary | ICD-10-CM

## 2016-09-15 ENCOUNTER — Encounter (INDEPENDENT_AMBULATORY_CARE_PROVIDER_SITE_OTHER): Payer: PPO | Admitting: Ophthalmology

## 2016-09-15 DIAGNOSIS — H34812 Central retinal vein occlusion, left eye, with macular edema: Secondary | ICD-10-CM

## 2016-09-15 DIAGNOSIS — H43813 Vitreous degeneration, bilateral: Secondary | ICD-10-CM | POA: Diagnosis not present

## 2016-09-15 DIAGNOSIS — E11311 Type 2 diabetes mellitus with unspecified diabetic retinopathy with macular edema: Secondary | ICD-10-CM

## 2016-09-15 DIAGNOSIS — E113412 Type 2 diabetes mellitus with severe nonproliferative diabetic retinopathy with macular edema, left eye: Secondary | ICD-10-CM | POA: Diagnosis not present

## 2016-09-15 DIAGNOSIS — E113291 Type 2 diabetes mellitus with mild nonproliferative diabetic retinopathy without macular edema, right eye: Secondary | ICD-10-CM | POA: Diagnosis not present

## 2016-11-03 ENCOUNTER — Encounter (INDEPENDENT_AMBULATORY_CARE_PROVIDER_SITE_OTHER): Payer: PPO | Admitting: Ophthalmology

## 2016-11-03 DIAGNOSIS — E11311 Type 2 diabetes mellitus with unspecified diabetic retinopathy with macular edema: Secondary | ICD-10-CM | POA: Diagnosis not present

## 2016-11-03 DIAGNOSIS — H2511 Age-related nuclear cataract, right eye: Secondary | ICD-10-CM | POA: Diagnosis not present

## 2016-11-03 DIAGNOSIS — E113412 Type 2 diabetes mellitus with severe nonproliferative diabetic retinopathy with macular edema, left eye: Secondary | ICD-10-CM

## 2016-11-03 DIAGNOSIS — H34812 Central retinal vein occlusion, left eye, with macular edema: Secondary | ICD-10-CM | POA: Diagnosis not present

## 2016-11-03 DIAGNOSIS — E113291 Type 2 diabetes mellitus with mild nonproliferative diabetic retinopathy without macular edema, right eye: Secondary | ICD-10-CM | POA: Diagnosis not present

## 2016-11-03 DIAGNOSIS — H43813 Vitreous degeneration, bilateral: Secondary | ICD-10-CM

## 2016-11-09 DIAGNOSIS — E119 Type 2 diabetes mellitus without complications: Secondary | ICD-10-CM | POA: Diagnosis not present

## 2016-11-09 DIAGNOSIS — Z125 Encounter for screening for malignant neoplasm of prostate: Secondary | ICD-10-CM | POA: Diagnosis not present

## 2016-11-09 DIAGNOSIS — Z0000 Encounter for general adult medical examination without abnormal findings: Secondary | ICD-10-CM | POA: Diagnosis not present

## 2016-11-15 DIAGNOSIS — I10 Essential (primary) hypertension: Secondary | ICD-10-CM | POA: Diagnosis not present

## 2016-11-15 DIAGNOSIS — E119 Type 2 diabetes mellitus without complications: Secondary | ICD-10-CM | POA: Diagnosis not present

## 2016-11-15 DIAGNOSIS — K7689 Other specified diseases of liver: Secondary | ICD-10-CM | POA: Diagnosis not present

## 2016-11-16 DIAGNOSIS — H401122 Primary open-angle glaucoma, left eye, moderate stage: Secondary | ICD-10-CM | POA: Diagnosis not present

## 2016-11-16 DIAGNOSIS — H401111 Primary open-angle glaucoma, right eye, mild stage: Secondary | ICD-10-CM | POA: Diagnosis not present

## 2016-11-16 DIAGNOSIS — H25811 Combined forms of age-related cataract, right eye: Secondary | ICD-10-CM | POA: Diagnosis not present

## 2016-11-16 DIAGNOSIS — H348122 Central retinal vein occlusion, left eye, stable: Secondary | ICD-10-CM | POA: Diagnosis not present

## 2016-11-16 DIAGNOSIS — Z961 Presence of intraocular lens: Secondary | ICD-10-CM | POA: Diagnosis not present

## 2016-12-15 ENCOUNTER — Other Ambulatory Visit: Payer: Self-pay | Admitting: Internal Medicine

## 2016-12-15 DIAGNOSIS — R945 Abnormal results of liver function studies: Secondary | ICD-10-CM

## 2016-12-23 ENCOUNTER — Other Ambulatory Visit: Payer: PPO

## 2016-12-28 DIAGNOSIS — H401122 Primary open-angle glaucoma, left eye, moderate stage: Secondary | ICD-10-CM | POA: Diagnosis not present

## 2016-12-28 DIAGNOSIS — H25811 Combined forms of age-related cataract, right eye: Secondary | ICD-10-CM | POA: Diagnosis not present

## 2016-12-28 DIAGNOSIS — Z961 Presence of intraocular lens: Secondary | ICD-10-CM | POA: Diagnosis not present

## 2016-12-28 DIAGNOSIS — H401111 Primary open-angle glaucoma, right eye, mild stage: Secondary | ICD-10-CM | POA: Diagnosis not present

## 2016-12-29 ENCOUNTER — Encounter (INDEPENDENT_AMBULATORY_CARE_PROVIDER_SITE_OTHER): Payer: PPO | Admitting: Ophthalmology

## 2016-12-29 DIAGNOSIS — H43813 Vitreous degeneration, bilateral: Secondary | ICD-10-CM

## 2016-12-29 DIAGNOSIS — E113291 Type 2 diabetes mellitus with mild nonproliferative diabetic retinopathy without macular edema, right eye: Secondary | ICD-10-CM | POA: Diagnosis not present

## 2016-12-29 DIAGNOSIS — H34812 Central retinal vein occlusion, left eye, with macular edema: Secondary | ICD-10-CM | POA: Diagnosis not present

## 2016-12-29 DIAGNOSIS — E113312 Type 2 diabetes mellitus with moderate nonproliferative diabetic retinopathy with macular edema, left eye: Secondary | ICD-10-CM | POA: Diagnosis not present

## 2016-12-29 DIAGNOSIS — H2511 Age-related nuclear cataract, right eye: Secondary | ICD-10-CM

## 2016-12-29 DIAGNOSIS — E11311 Type 2 diabetes mellitus with unspecified diabetic retinopathy with macular edema: Secondary | ICD-10-CM

## 2016-12-30 ENCOUNTER — Ambulatory Visit
Admission: RE | Admit: 2016-12-30 | Discharge: 2016-12-30 | Disposition: A | Discharge status: Home / Self Care | Payer: PPO | Source: Ambulatory Visit | Attending: Internal Medicine | Admitting: Internal Medicine

## 2016-12-30 DIAGNOSIS — R945 Abnormal results of liver function studies: Secondary | ICD-10-CM

## 2016-12-30 DIAGNOSIS — R7989 Other specified abnormal findings of blood chemistry: Secondary | ICD-10-CM | POA: Diagnosis not present

## 2017-01-03 DIAGNOSIS — K7689 Other specified diseases of liver: Secondary | ICD-10-CM | POA: Diagnosis not present

## 2017-01-10 DIAGNOSIS — Z0000 Encounter for general adult medical examination without abnormal findings: Secondary | ICD-10-CM | POA: Diagnosis not present

## 2017-01-10 DIAGNOSIS — E119 Type 2 diabetes mellitus without complications: Secondary | ICD-10-CM | POA: Diagnosis not present

## 2017-01-10 DIAGNOSIS — E7800 Pure hypercholesterolemia, unspecified: Secondary | ICD-10-CM | POA: Diagnosis not present

## 2017-01-13 DIAGNOSIS — H401122 Primary open-angle glaucoma, left eye, moderate stage: Secondary | ICD-10-CM | POA: Diagnosis not present

## 2017-02-02 DIAGNOSIS — H5201 Hypermetropia, right eye: Secondary | ICD-10-CM | POA: Diagnosis not present

## 2017-02-05 IMAGING — US US ABDOMEN COMPLETE
1 series · 13 of 25 positions shown · non-contrast
Comparison: Abdominal ultrasound October 04, 2011

CLINICAL DATA: Abnormal liver function studies

EXAM:
ABDOMEN ULTRASOUND COMPLETE

[Series 1: us abdomen complete · 0.28mm/px · 13 of 123 slices shown]
[im 1/123]
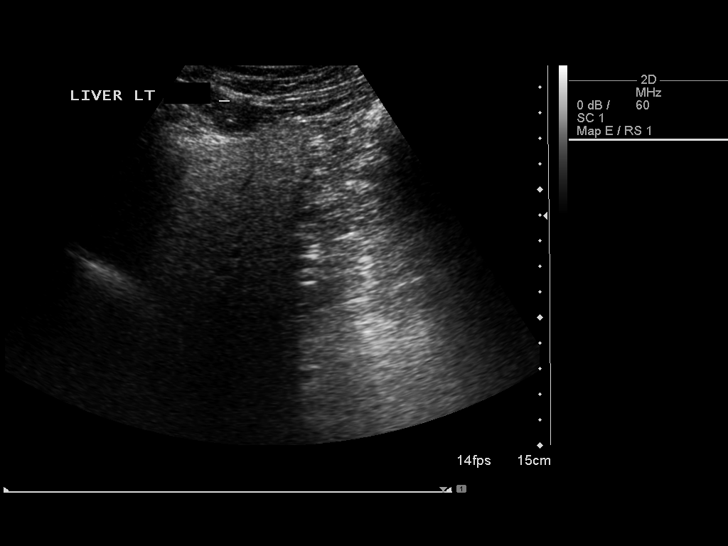
[im 11/123]
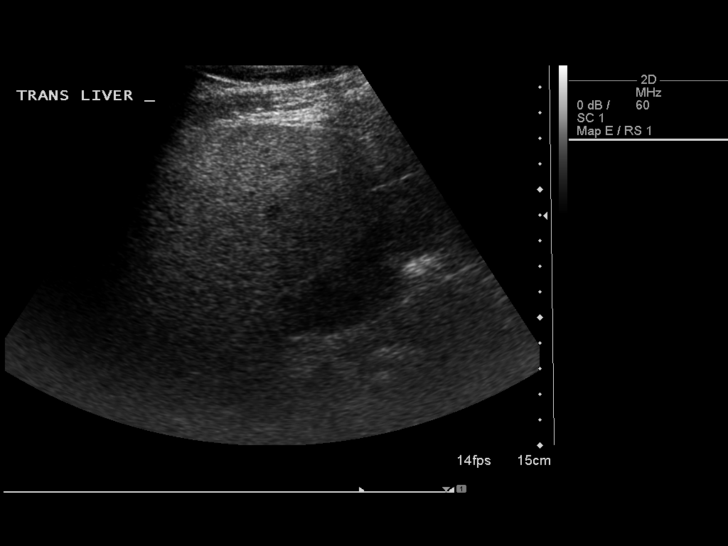
[im 21/123]
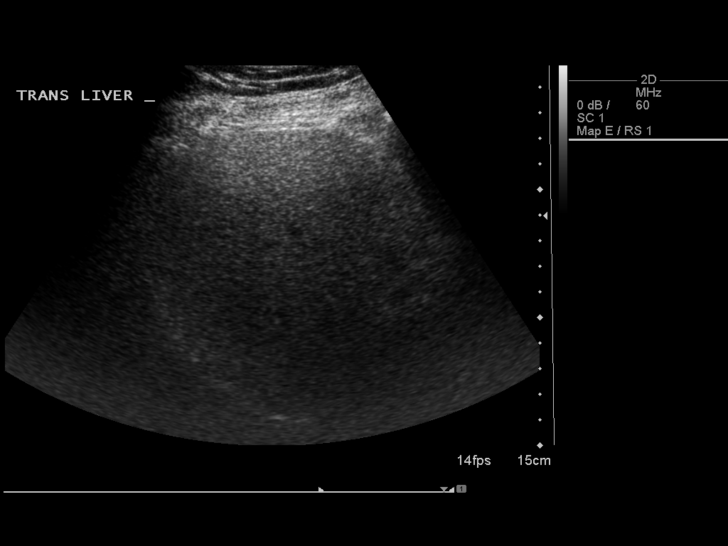
[im 31/123]
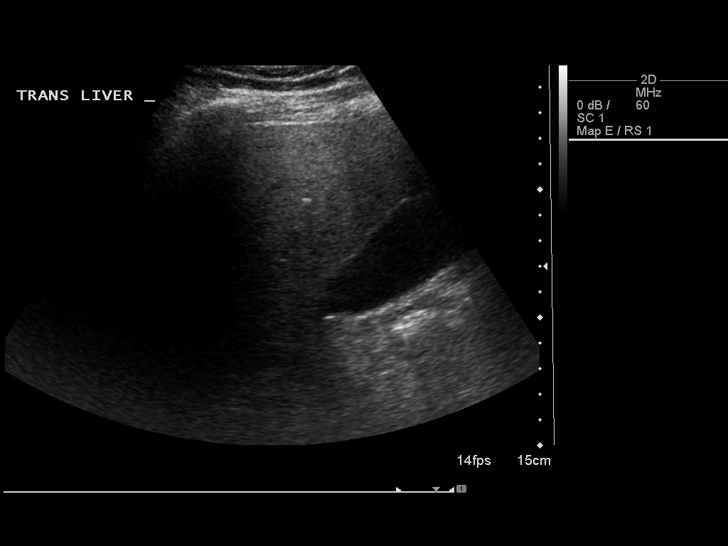
[im 41/123]
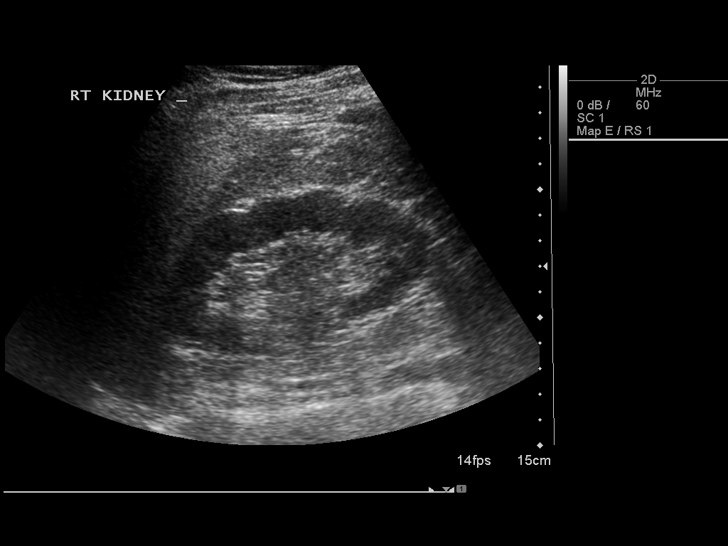
[im 51/123]
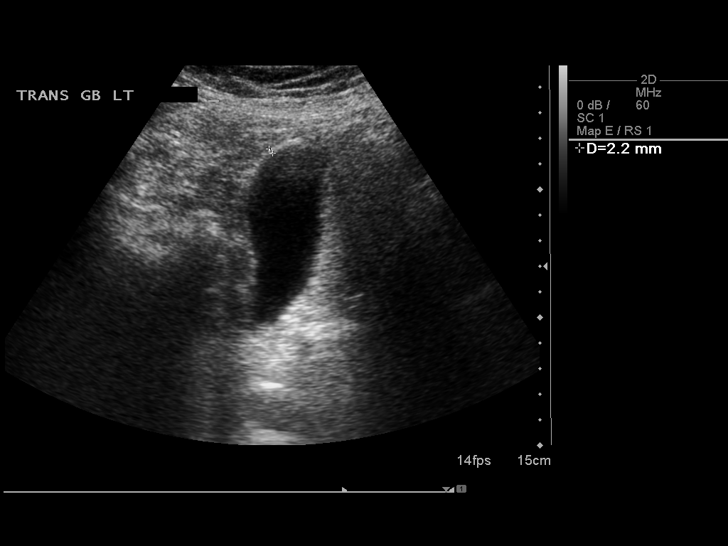
[im 62/123]
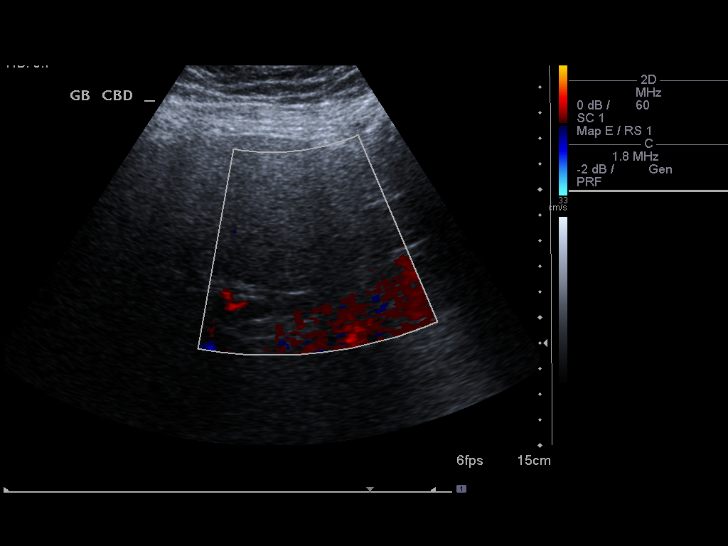
[im 72/123]
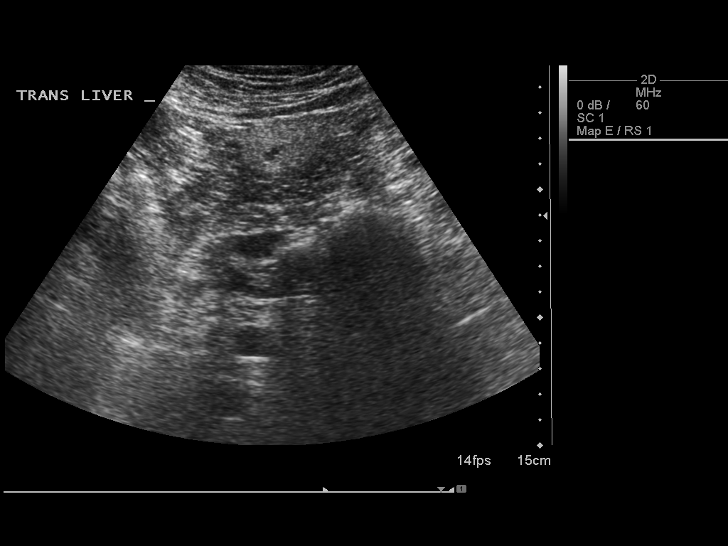
[im 82/123]
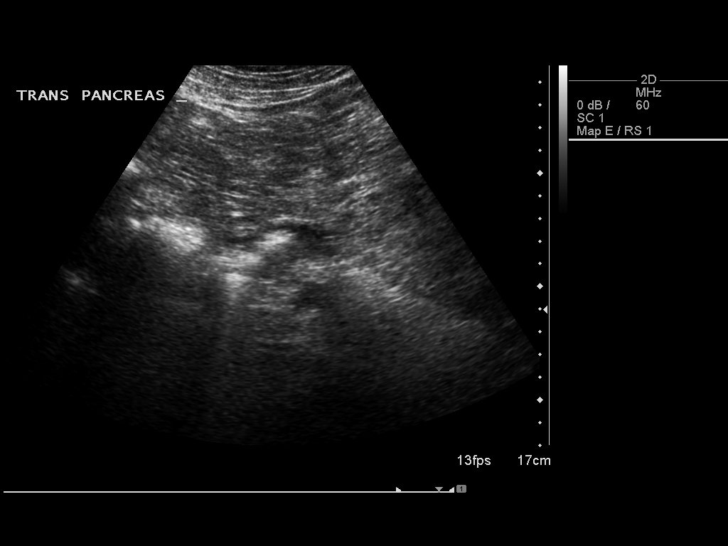
[im 92/123]
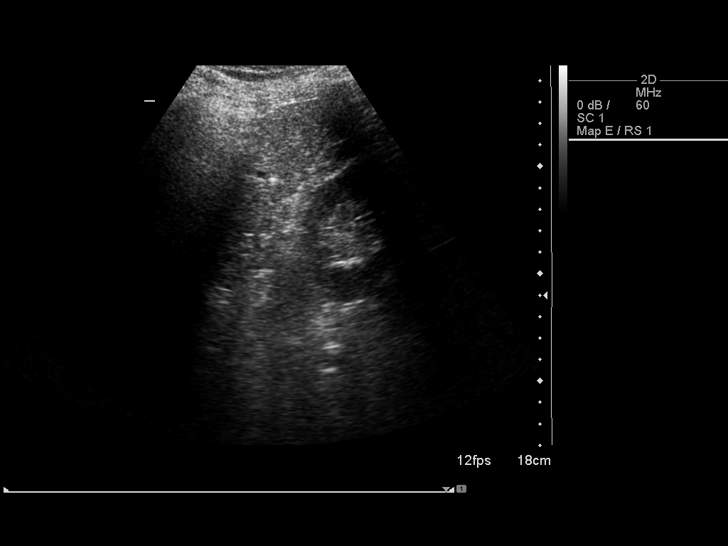
[im 102/123]
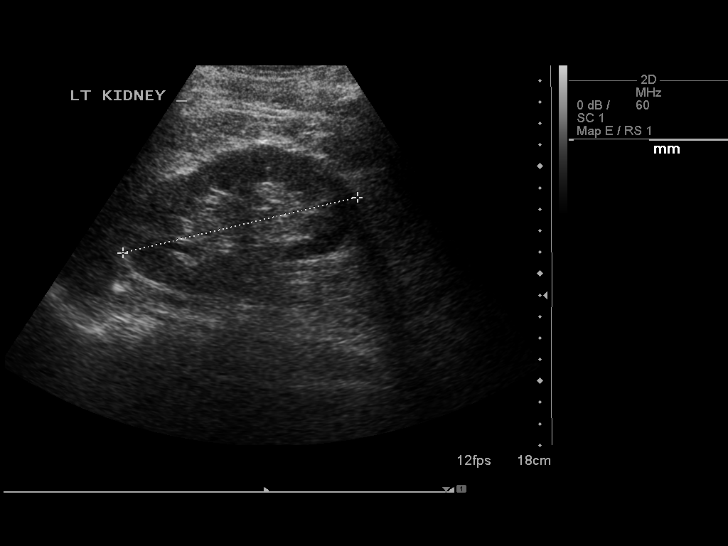
[im 112/123]
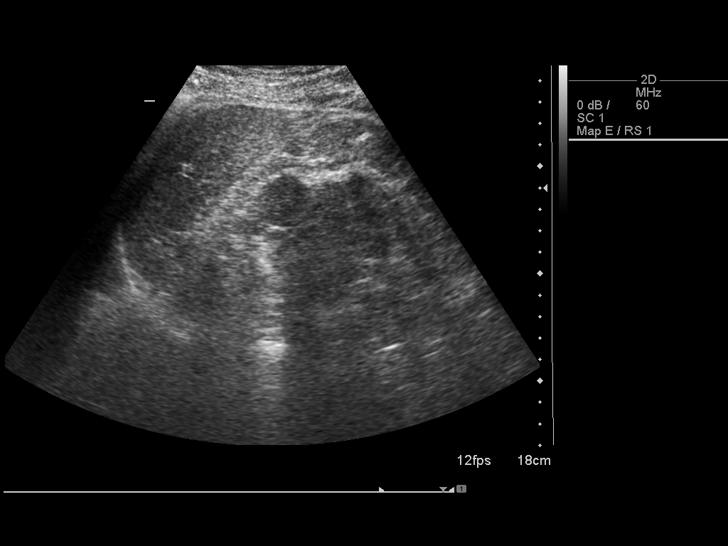
[im 123/123]
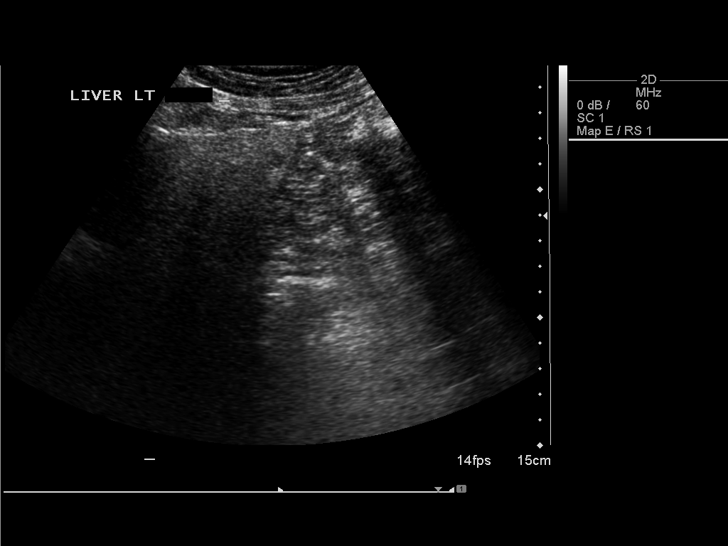

[13 of 25 positions shown; findings below may reference images not displayed]

FINDINGS: Gallbladder: No gallstones or wall thickening visualized. No
sonographic Murphy sign noted by sonographer.

Common bile duct: Diameter: 4.5 mm

Liver: The hepatic echotexture is mildly increased and is
heterogeneous. There is no intrahepatic ductal dilation. The surface
contour of the liver appears normal. There is an 11 mm diameter cyst
in the left hepatic lobe.

IVC: No abnormality visualized.

Pancreas: Bowel gas limits visualization of the pancreas.

Spleen: Size and appearance within normal limits.

Right Kidney: Length: 11.5 cm. Echogenicity within normal limits. No
mass or hydronephrosis visualized.

Left Kidney: Length: 11.2 cm. Echogenicity within normal limits. No
mass or hydronephrosis visualized.

Abdominal aorta: No aneurysm visualized.

Other findings: None.
IMPRESSION: 1. This is a limited study due to bowel gas and the patient's body
habitus and shadowing from ribs. There is heterogeneously increased
echotexture of the liver without a discrete mass. Given the
limitations of the study however hepatic protocol MRI or CT scanning
is recommended.
2. The gallbladder, common bile duct, and visualized portions of the
pancreas are normal.
3. No definite acute abnormality observed elsewhere within the
abdomen.

## 2017-02-09 ENCOUNTER — Encounter (INDEPENDENT_AMBULATORY_CARE_PROVIDER_SITE_OTHER): Payer: PPO | Admitting: Ophthalmology

## 2017-02-09 DIAGNOSIS — H34812 Central retinal vein occlusion, left eye, with macular edema: Secondary | ICD-10-CM

## 2017-02-09 DIAGNOSIS — E113312 Type 2 diabetes mellitus with moderate nonproliferative diabetic retinopathy with macular edema, left eye: Secondary | ICD-10-CM

## 2017-02-09 DIAGNOSIS — E11311 Type 2 diabetes mellitus with unspecified diabetic retinopathy with macular edema: Secondary | ICD-10-CM | POA: Diagnosis not present

## 2017-02-09 DIAGNOSIS — E113291 Type 2 diabetes mellitus with mild nonproliferative diabetic retinopathy without macular edema, right eye: Secondary | ICD-10-CM

## 2017-02-09 DIAGNOSIS — H2511 Age-related nuclear cataract, right eye: Secondary | ICD-10-CM

## 2017-02-09 DIAGNOSIS — H43813 Vitreous degeneration, bilateral: Secondary | ICD-10-CM

## 2017-02-24 DIAGNOSIS — H401122 Primary open-angle glaucoma, left eye, moderate stage: Secondary | ICD-10-CM | POA: Diagnosis not present

## 2017-02-24 DIAGNOSIS — H52211 Irregular astigmatism, right eye: Secondary | ICD-10-CM | POA: Diagnosis not present

## 2017-02-24 DIAGNOSIS — Z961 Presence of intraocular lens: Secondary | ICD-10-CM | POA: Diagnosis not present

## 2017-02-24 DIAGNOSIS — Z9889 Other specified postprocedural states: Secondary | ICD-10-CM | POA: Diagnosis not present

## 2017-02-24 DIAGNOSIS — H25811 Combined forms of age-related cataract, right eye: Secondary | ICD-10-CM | POA: Diagnosis not present

## 2017-02-24 DIAGNOSIS — H401111 Primary open-angle glaucoma, right eye, mild stage: Secondary | ICD-10-CM | POA: Diagnosis not present

## 2017-03-17 ENCOUNTER — Encounter (INDEPENDENT_AMBULATORY_CARE_PROVIDER_SITE_OTHER): Payer: PPO | Admitting: Ophthalmology

## 2017-03-17 DIAGNOSIS — H43813 Vitreous degeneration, bilateral: Secondary | ICD-10-CM | POA: Diagnosis not present

## 2017-03-17 DIAGNOSIS — I10 Essential (primary) hypertension: Secondary | ICD-10-CM | POA: Diagnosis not present

## 2017-03-17 DIAGNOSIS — E113312 Type 2 diabetes mellitus with moderate nonproliferative diabetic retinopathy with macular edema, left eye: Secondary | ICD-10-CM

## 2017-03-17 DIAGNOSIS — H35033 Hypertensive retinopathy, bilateral: Secondary | ICD-10-CM | POA: Diagnosis not present

## 2017-03-17 DIAGNOSIS — E11311 Type 2 diabetes mellitus with unspecified diabetic retinopathy with macular edema: Secondary | ICD-10-CM | POA: Diagnosis not present

## 2017-03-17 DIAGNOSIS — H34812 Central retinal vein occlusion, left eye, with macular edema: Secondary | ICD-10-CM | POA: Diagnosis not present

## 2017-03-17 DIAGNOSIS — E113391 Type 2 diabetes mellitus with moderate nonproliferative diabetic retinopathy without macular edema, right eye: Secondary | ICD-10-CM | POA: Diagnosis not present

## 2017-03-22 ENCOUNTER — Ambulatory Visit (INDEPENDENT_AMBULATORY_CARE_PROVIDER_SITE_OTHER): Payer: PPO | Admitting: Podiatry

## 2017-03-22 ENCOUNTER — Encounter: Payer: Self-pay | Admitting: Podiatry

## 2017-03-22 ENCOUNTER — Ambulatory Visit: Payer: PPO | Admitting: Podiatry

## 2017-03-22 ENCOUNTER — Ambulatory Visit (INDEPENDENT_AMBULATORY_CARE_PROVIDER_SITE_OTHER): Payer: PPO

## 2017-03-22 VITALS — BP 164/92 | HR 87 | Resp 16 | Ht 65.0 in | Wt 167.0 lb

## 2017-03-22 DIAGNOSIS — M79671 Pain in right foot: Secondary | ICD-10-CM | POA: Diagnosis not present

## 2017-03-22 DIAGNOSIS — M2011 Hallux valgus (acquired), right foot: Secondary | ICD-10-CM | POA: Diagnosis not present

## 2017-03-22 DIAGNOSIS — M7751 Other enthesopathy of right foot: Secondary | ICD-10-CM

## 2017-03-22 DIAGNOSIS — M2012 Hallux valgus (acquired), left foot: Secondary | ICD-10-CM | POA: Diagnosis not present

## 2017-03-22 DIAGNOSIS — M779 Enthesopathy, unspecified: Principal | ICD-10-CM

## 2017-03-22 DIAGNOSIS — M778 Other enthesopathies, not elsewhere classified: Secondary | ICD-10-CM

## 2017-03-22 NOTE — Progress Notes (Signed)
   Subjective:    Patient ID: James Avery, male    DOB: Apr 12, 1934, 83 y.o.   MRN: [id number]  HPI    This patient presents today complaining of approximately 1-2 year history of pain in or around his right great toe joint when walking and wearing dress shoes. The symptoms increase with activity and are relieved with rest  and wearing a soft shoe. He also complains of approximately one year history of a sharp pain that persist for approximately 30 seconds in around the right rear foot area that occurs approximately every 1-2 hours and last for approximately 30 seconds. The symptoms occur on and off weightbearing and have been rather persistent without progression over the past year. He denies any other areas of complaint other than the right rear foot area.  Patient is a diabetic for approximately 5 years and denies history of foot amputation, amputation or numbness Patient denies smoking history     Review of Systems  All other systems reviewed and are negative.      Objective:   Physical Exam  Orientated 3  Vascular: No peripheral edema bilaterally DP and PT pulses 2/4 bilaterally Capillary reflex immediate bilaterally  Neurological: Sensation to 10 g monofilament wire intact 5/5 bilaterally Vibratory sensation reactive bilaterally Ankle reflexes reactive bilaterally   Dermatological: No open skin lesions bilaterally Atrophic skin with absent hair growth bilaterally No skin lesions noted bilaterally  Musculoskeletal: HAV bilaterally Tenderness to palpation right first MPJ with mild tenderness on range of motion Palpation right rear foot elicits tenderness along the base of first metatarsal cuneiform area without a palpable tenderness Tenderness over right medial navicular area without palpable lesions Weightbearing patient able to heel off bilaterally and unilaterally without any difficulty  X-ray examination weightbearing right foot dated 03/22/2017  Intact  bony structure without fracture and/or dislocation HAV right Decrease joint space right first MPJ Posterior callus cane heel spur Bone density appears adequate  Radiographic impression: No acute bony abnormality noted in weightbearing x-ray 03/22/2017          Assessment & Plan:    Assessment: Diabetic with satisfactory neurovascular status Symptomatic right HAV Localized neuropathic symptoms right rear foot undetermined origin Right rear foot arthralgia/capsulitis  Plan: I reviewed the results of the exam an x-ray with patient today. We discussed treatment options for the bunion including shoe modification, reduction of activity and padding and possible surgical treatment Also discussed with patient the neuropathic-like symptoms that were occurring the right rear foot without a specific diagnosis at this time. Discussed the application of a bandage to restrict motion of the foot to see if this would reduce some of the symptoms and rear foot. Apply fascial strapping true right foot with instructions to leave on 3-5 days to determine if the right rear foot pain would respond to supportive therapy. If there was a positive response would consider accommodative foot orthotic  Reappoint 2 weeks

## 2017-03-22 NOTE — Patient Instructions (Addendum)
If possible leave  bandage on right foot up to 5 days and then remove.     Diabetes and Foot Care Diabetes may cause you to have problems because of poor blood supply (circulation) to your feet and legs. This may cause the skin on your feet to become thinner, break easier, and heal more slowly. Your skin may become dry, and the skin may peel and crack. You may also have nerve damage in your legs and feet causing decreased feeling in them. You may not notice minor injuries to your feet that could lead to infections or more serious problems. Taking care of your feet is one of the most important things you can do for yourself. Follow these instructions at home:  Wear shoes at all times, even in the house. Do not go barefoot. Bare feet are easily injured.  Check your feet daily for blisters, cuts, and redness. If you cannot see the bottom of your feet, use a mirror or ask someone for help.  Wash your feet with warm water (do not use hot water) and mild soap. Then pat your feet and the areas between your toes until they are completely dry. Do not soak your feet as this can dry your skin.  Apply a moisturizing lotion or petroleum jelly (that does not contain alcohol and is unscented) to the skin on your feet and to dry, brittle toenails. Do not apply lotion between your toes.  Trim your toenails straight across. Do not dig under them or around the cuticle. File the edges of your nails with an emery board or nail file.  Do not cut corns or calluses or try to remove them with medicine.  Wear clean socks or stockings every day. Make sure they are not too tight. Do not wear knee-high stockings since they may decrease blood flow to your legs.  Wear shoes that fit properly and have enough cushioning. To break in new shoes, wear them for just a few hours a day. This prevents you from injuring your feet. Always look in your shoes before you put them on to be sure there are no objects inside.  Do not  cross your legs. This may decrease the blood flow to your feet.  If you find a minor scrape, cut, or break in the skin on your feet, keep it and the skin around it clean and dry. These areas may be cleansed with mild soap and water. Do not cleanse the area with peroxide, alcohol, or iodine.  When you remove an adhesive bandage, be sure not to damage the skin around it.  If you have a wound, look at it several times a day to make sure it is healing.  Do not use heating pads or hot water bottles. They may burn your skin. If you have lost feeling in your feet or legs, you may not know it is happening until it is too late.  Make sure your health care provider performs a complete foot exam at least annually or more often if you have foot problems. Report any cuts, sores, or bruises to your health care provider immediately. Contact a health care provider if:  You have an injury that is not healing.  You have cuts or breaks in the skin.  You have an ingrown nail.  You notice redness on your legs or feet.  You feel burning or tingling in your legs or feet.  You have pain or cramps in your legs and feet.  Your legs  or feet are numb.  Your feet always feel cold. Get help right away if:  There is increasing redness, swelling, or pain in or around a wound.  There is a red line that goes up your leg.  Pus is coming from a wound.  You develop a fever or as directed by your health care provider.  You notice a bad smell coming from an ulcer or wound. This information is not intended to replace advice given to you by your health care provider. Make sure you discuss any questions you have with your health care provider. Document Released: 11/18/2000 Document Revised: 04/28/2016 Document Reviewed: 04/30/2013 Elsevier Interactive Patient Education  2017 [employer].

## 2017-04-04 DIAGNOSIS — H2511 Age-related nuclear cataract, right eye: Secondary | ICD-10-CM | POA: Diagnosis not present

## 2017-04-10 DIAGNOSIS — H25811 Combined forms of age-related cataract, right eye: Secondary | ICD-10-CM | POA: Diagnosis not present

## 2017-04-10 DIAGNOSIS — H2511 Age-related nuclear cataract, right eye: Secondary | ICD-10-CM | POA: Diagnosis not present

## 2017-04-19 ENCOUNTER — Ambulatory Visit (INDEPENDENT_AMBULATORY_CARE_PROVIDER_SITE_OTHER): Payer: PPO | Admitting: Podiatry

## 2017-04-19 DIAGNOSIS — M722 Plantar fascial fibromatosis: Secondary | ICD-10-CM

## 2017-04-19 NOTE — Progress Notes (Signed)
Patient ID: James Avery, male   DOB: [birth year], 83 y.o.   MRN: [id number]   Subjective: This patient presents from the follow-up visit of her 03/22/2017. At that time a fascial strapping was applied. Patient left fascial's trapping on 7 days. He states that the first day he had some increasing pain in them the following days until you remove the Ace wrap he was pain-free. Now when he weightbears he complains of pain in or around his right mid arch midfoot area. The symptoms are primarily occur on weightbearing and relieved with rest. Patient states that he would like to have something that would reduce pressure on his arch   Objective:  Orientated 3  Vascular: No peripheral edema bilaterally DP and PT pulses 2/4 bilaterally Capillary reflex immediate bilaterally  Neurological: Sensation to 10 g monofilament wire intact 5/5 bilaterally Vibratory sensation reactive bilaterally Ankle reflexes reactive bilaterally   Dermatological: No open skin lesions bilaterally Atrophic skin with absent hair growth bilaterally No skin lesions noted bilaterally  Musculoskeletal: HAV bilaterally Tenderness to palpation right first MPJ with mild tenderness on range of motion Palpation right medial plantar fascial band and medial midfoot muscular belly elicits mild discomfort which duplicates patient's discomfort Weightbearing patient able to heel off bilaterally and unilaterally without any difficulty  X-ray examination weightbearing right foot dated 03/22/2017  Intact bony structure without fracture and/or dislocation HAV right Decrease joint space right first MPJ Posterior callus cane heel spur Bone density appears adequate  Radiographic impression: No acute bony abnormality noted in weightbearing x-ray 04  Assessment: Plantar fasciitis medial fascial band mid foot right foot  Plan: Dispensed fasciitis strap for patient to wear an ongoing daily basis in athletic style shoe.  Patient instructed that if the symptoms exacerbate to return for further evaluation. Also, would consider adding on a, native functional orthotic  Reappoint at patient's request

## 2017-04-19 NOTE — Patient Instructions (Signed)
Please wear the plantar fasciitis strap over sock an ongoing continuous basis on the right foot Wear in athletic style shoe Return as needed

## 2017-05-02 ENCOUNTER — Encounter (INDEPENDENT_AMBULATORY_CARE_PROVIDER_SITE_OTHER): Payer: PPO | Admitting: Ophthalmology

## 2017-05-02 DIAGNOSIS — E113312 Type 2 diabetes mellitus with moderate nonproliferative diabetic retinopathy with macular edema, left eye: Secondary | ICD-10-CM | POA: Diagnosis not present

## 2017-05-02 DIAGNOSIS — E11311 Type 2 diabetes mellitus with unspecified diabetic retinopathy with macular edema: Secondary | ICD-10-CM | POA: Diagnosis not present

## 2017-05-02 DIAGNOSIS — H43813 Vitreous degeneration, bilateral: Secondary | ICD-10-CM | POA: Diagnosis not present

## 2017-05-02 DIAGNOSIS — H34812 Central retinal vein occlusion, left eye, with macular edema: Secondary | ICD-10-CM | POA: Diagnosis not present

## 2017-05-02 DIAGNOSIS — H2512 Age-related nuclear cataract, left eye: Secondary | ICD-10-CM

## 2017-05-02 DIAGNOSIS — E113291 Type 2 diabetes mellitus with mild nonproliferative diabetic retinopathy without macular edema, right eye: Secondary | ICD-10-CM | POA: Diagnosis not present

## 2017-07-11 ENCOUNTER — Encounter (INDEPENDENT_AMBULATORY_CARE_PROVIDER_SITE_OTHER): Payer: PPO | Admitting: Ophthalmology

## 2017-07-11 DIAGNOSIS — H43813 Vitreous degeneration, bilateral: Secondary | ICD-10-CM | POA: Diagnosis not present

## 2017-07-11 DIAGNOSIS — E113391 Type 2 diabetes mellitus with moderate nonproliferative diabetic retinopathy without macular edema, right eye: Secondary | ICD-10-CM | POA: Diagnosis not present

## 2017-07-11 DIAGNOSIS — I10 Essential (primary) hypertension: Secondary | ICD-10-CM

## 2017-07-11 DIAGNOSIS — H34812 Central retinal vein occlusion, left eye, with macular edema: Secondary | ICD-10-CM

## 2017-07-11 DIAGNOSIS — H35033 Hypertensive retinopathy, bilateral: Secondary | ICD-10-CM

## 2017-07-11 DIAGNOSIS — E113412 Type 2 diabetes mellitus with severe nonproliferative diabetic retinopathy with macular edema, left eye: Secondary | ICD-10-CM

## 2017-07-11 DIAGNOSIS — E11311 Type 2 diabetes mellitus with unspecified diabetic retinopathy with macular edema: Secondary | ICD-10-CM

## 2017-07-28 DIAGNOSIS — E7800 Pure hypercholesterolemia, unspecified: Secondary | ICD-10-CM | POA: Diagnosis not present

## 2017-07-28 DIAGNOSIS — E119 Type 2 diabetes mellitus without complications: Secondary | ICD-10-CM | POA: Diagnosis not present

## 2017-07-28 DIAGNOSIS — Z0000 Encounter for general adult medical examination without abnormal findings: Secondary | ICD-10-CM | POA: Diagnosis not present

## 2017-08-04 DIAGNOSIS — Z0000 Encounter for general adult medical examination without abnormal findings: Secondary | ICD-10-CM | POA: Diagnosis not present

## 2017-08-04 DIAGNOSIS — E119 Type 2 diabetes mellitus without complications: Secondary | ICD-10-CM | POA: Diagnosis not present

## 2017-08-15 ENCOUNTER — Encounter (INDEPENDENT_AMBULATORY_CARE_PROVIDER_SITE_OTHER): Payer: PPO | Admitting: Ophthalmology

## 2017-08-15 DIAGNOSIS — E113412 Type 2 diabetes mellitus with severe nonproliferative diabetic retinopathy with macular edema, left eye: Secondary | ICD-10-CM | POA: Diagnosis not present

## 2017-08-15 DIAGNOSIS — H43813 Vitreous degeneration, bilateral: Secondary | ICD-10-CM | POA: Diagnosis not present

## 2017-08-15 DIAGNOSIS — H35033 Hypertensive retinopathy, bilateral: Secondary | ICD-10-CM | POA: Diagnosis not present

## 2017-08-15 DIAGNOSIS — I10 Essential (primary) hypertension: Secondary | ICD-10-CM | POA: Diagnosis not present

## 2017-08-15 DIAGNOSIS — H34812 Central retinal vein occlusion, left eye, with macular edema: Secondary | ICD-10-CM | POA: Diagnosis not present

## 2017-08-15 DIAGNOSIS — E11311 Type 2 diabetes mellitus with unspecified diabetic retinopathy with macular edema: Secondary | ICD-10-CM

## 2017-08-15 DIAGNOSIS — E113391 Type 2 diabetes mellitus with moderate nonproliferative diabetic retinopathy without macular edema, right eye: Secondary | ICD-10-CM

## 2017-09-14 ENCOUNTER — Encounter (INDEPENDENT_AMBULATORY_CARE_PROVIDER_SITE_OTHER): Payer: PPO | Admitting: Ophthalmology

## 2017-09-14 DIAGNOSIS — H43813 Vitreous degeneration, bilateral: Secondary | ICD-10-CM

## 2017-09-14 DIAGNOSIS — H35033 Hypertensive retinopathy, bilateral: Secondary | ICD-10-CM | POA: Diagnosis not present

## 2017-09-14 DIAGNOSIS — E11311 Type 2 diabetes mellitus with unspecified diabetic retinopathy with macular edema: Secondary | ICD-10-CM

## 2017-09-14 DIAGNOSIS — E113392 Type 2 diabetes mellitus with moderate nonproliferative diabetic retinopathy without macular edema, left eye: Secondary | ICD-10-CM | POA: Diagnosis not present

## 2017-09-14 DIAGNOSIS — I10 Essential (primary) hypertension: Secondary | ICD-10-CM

## 2017-09-14 DIAGNOSIS — E113311 Type 2 diabetes mellitus with moderate nonproliferative diabetic retinopathy with macular edema, right eye: Secondary | ICD-10-CM | POA: Diagnosis not present

## 2017-09-14 DIAGNOSIS — H34812 Central retinal vein occlusion, left eye, with macular edema: Secondary | ICD-10-CM | POA: Diagnosis not present

## 2017-10-12 ENCOUNTER — Encounter (INDEPENDENT_AMBULATORY_CARE_PROVIDER_SITE_OTHER): Payer: PPO | Admitting: Ophthalmology

## 2017-10-12 DIAGNOSIS — E113291 Type 2 diabetes mellitus with mild nonproliferative diabetic retinopathy without macular edema, right eye: Secondary | ICD-10-CM | POA: Diagnosis not present

## 2017-10-12 DIAGNOSIS — E113312 Type 2 diabetes mellitus with moderate nonproliferative diabetic retinopathy with macular edema, left eye: Secondary | ICD-10-CM

## 2017-10-12 DIAGNOSIS — I10 Essential (primary) hypertension: Secondary | ICD-10-CM

## 2017-10-12 DIAGNOSIS — E11311 Type 2 diabetes mellitus with unspecified diabetic retinopathy with macular edema: Secondary | ICD-10-CM | POA: Diagnosis not present

## 2017-10-12 DIAGNOSIS — H43813 Vitreous degeneration, bilateral: Secondary | ICD-10-CM

## 2017-10-12 DIAGNOSIS — H35033 Hypertensive retinopathy, bilateral: Secondary | ICD-10-CM | POA: Diagnosis not present

## 2017-10-12 DIAGNOSIS — H34812 Central retinal vein occlusion, left eye, with macular edema: Secondary | ICD-10-CM | POA: Diagnosis not present

## 2017-10-24 DIAGNOSIS — H401122 Primary open-angle glaucoma, left eye, moderate stage: Secondary | ICD-10-CM | POA: Diagnosis not present

## 2017-10-24 DIAGNOSIS — H26491 Other secondary cataract, right eye: Secondary | ICD-10-CM | POA: Diagnosis not present

## 2017-10-24 DIAGNOSIS — Z961 Presence of intraocular lens: Secondary | ICD-10-CM | POA: Diagnosis not present

## 2017-10-24 DIAGNOSIS — H401111 Primary open-angle glaucoma, right eye, mild stage: Secondary | ICD-10-CM | POA: Diagnosis not present

## 2017-10-30 DIAGNOSIS — E119 Type 2 diabetes mellitus without complications: Secondary | ICD-10-CM | POA: Diagnosis not present

## 2017-11-06 DIAGNOSIS — E7800 Pure hypercholesterolemia, unspecified: Secondary | ICD-10-CM | POA: Diagnosis not present

## 2017-11-06 DIAGNOSIS — E119 Type 2 diabetes mellitus without complications: Secondary | ICD-10-CM | POA: Diagnosis not present

## 2017-11-09 ENCOUNTER — Encounter (INDEPENDENT_AMBULATORY_CARE_PROVIDER_SITE_OTHER): Payer: PPO | Admitting: Ophthalmology

## 2017-11-09 DIAGNOSIS — H43813 Vitreous degeneration, bilateral: Secondary | ICD-10-CM

## 2017-11-09 DIAGNOSIS — I10 Essential (primary) hypertension: Secondary | ICD-10-CM

## 2017-11-09 DIAGNOSIS — E11311 Type 2 diabetes mellitus with unspecified diabetic retinopathy with macular edema: Secondary | ICD-10-CM | POA: Diagnosis not present

## 2017-11-09 DIAGNOSIS — H35033 Hypertensive retinopathy, bilateral: Secondary | ICD-10-CM

## 2017-11-09 DIAGNOSIS — H34812 Central retinal vein occlusion, left eye, with macular edema: Secondary | ICD-10-CM | POA: Diagnosis not present

## 2017-11-09 DIAGNOSIS — E113311 Type 2 diabetes mellitus with moderate nonproliferative diabetic retinopathy with macular edema, right eye: Secondary | ICD-10-CM | POA: Diagnosis not present

## 2017-11-09 DIAGNOSIS — E113392 Type 2 diabetes mellitus with moderate nonproliferative diabetic retinopathy without macular edema, left eye: Secondary | ICD-10-CM | POA: Diagnosis not present

## 2017-12-22 ENCOUNTER — Encounter (INDEPENDENT_AMBULATORY_CARE_PROVIDER_SITE_OTHER): Payer: PPO | Admitting: Ophthalmology

## 2017-12-22 DIAGNOSIS — E113312 Type 2 diabetes mellitus with moderate nonproliferative diabetic retinopathy with macular edema, left eye: Secondary | ICD-10-CM | POA: Diagnosis not present

## 2017-12-22 DIAGNOSIS — E113291 Type 2 diabetes mellitus with mild nonproliferative diabetic retinopathy without macular edema, right eye: Secondary | ICD-10-CM | POA: Diagnosis not present

## 2017-12-22 DIAGNOSIS — H34812 Central retinal vein occlusion, left eye, with macular edema: Secondary | ICD-10-CM | POA: Diagnosis not present

## 2017-12-22 DIAGNOSIS — I10 Essential (primary) hypertension: Secondary | ICD-10-CM | POA: Diagnosis not present

## 2017-12-22 DIAGNOSIS — E11311 Type 2 diabetes mellitus with unspecified diabetic retinopathy with macular edema: Secondary | ICD-10-CM

## 2017-12-22 DIAGNOSIS — H43813 Vitreous degeneration, bilateral: Secondary | ICD-10-CM | POA: Diagnosis not present

## 2017-12-22 DIAGNOSIS — H35033 Hypertensive retinopathy, bilateral: Secondary | ICD-10-CM

## 2018-02-02 ENCOUNTER — Encounter (INDEPENDENT_AMBULATORY_CARE_PROVIDER_SITE_OTHER): Payer: PPO | Admitting: Ophthalmology

## 2018-02-02 DIAGNOSIS — E11311 Type 2 diabetes mellitus with unspecified diabetic retinopathy with macular edema: Secondary | ICD-10-CM | POA: Diagnosis not present

## 2018-02-02 DIAGNOSIS — I10 Essential (primary) hypertension: Secondary | ICD-10-CM | POA: Diagnosis not present

## 2018-02-02 DIAGNOSIS — E113291 Type 2 diabetes mellitus with mild nonproliferative diabetic retinopathy without macular edema, right eye: Secondary | ICD-10-CM

## 2018-02-02 DIAGNOSIS — H35033 Hypertensive retinopathy, bilateral: Secondary | ICD-10-CM

## 2018-02-02 DIAGNOSIS — E113312 Type 2 diabetes mellitus with moderate nonproliferative diabetic retinopathy with macular edema, left eye: Secondary | ICD-10-CM

## 2018-02-02 DIAGNOSIS — H43813 Vitreous degeneration, bilateral: Secondary | ICD-10-CM

## 2018-02-02 DIAGNOSIS — H34812 Central retinal vein occlusion, left eye, with macular edema: Secondary | ICD-10-CM

## 2018-02-27 DIAGNOSIS — E119 Type 2 diabetes mellitus without complications: Secondary | ICD-10-CM | POA: Diagnosis not present

## 2018-03-23 ENCOUNTER — Encounter (INDEPENDENT_AMBULATORY_CARE_PROVIDER_SITE_OTHER): Payer: PPO | Admitting: Ophthalmology

## 2018-03-23 DIAGNOSIS — E11311 Type 2 diabetes mellitus with unspecified diabetic retinopathy with macular edema: Secondary | ICD-10-CM

## 2018-03-23 DIAGNOSIS — E113312 Type 2 diabetes mellitus with moderate nonproliferative diabetic retinopathy with macular edema, left eye: Secondary | ICD-10-CM | POA: Diagnosis not present

## 2018-03-23 DIAGNOSIS — E113291 Type 2 diabetes mellitus with mild nonproliferative diabetic retinopathy without macular edema, right eye: Secondary | ICD-10-CM

## 2018-03-23 DIAGNOSIS — H34812 Central retinal vein occlusion, left eye, with macular edema: Secondary | ICD-10-CM

## 2018-03-23 DIAGNOSIS — I10 Essential (primary) hypertension: Secondary | ICD-10-CM | POA: Diagnosis not present

## 2018-03-23 DIAGNOSIS — H35033 Hypertensive retinopathy, bilateral: Secondary | ICD-10-CM | POA: Diagnosis not present

## 2018-03-23 DIAGNOSIS — H43813 Vitreous degeneration, bilateral: Secondary | ICD-10-CM

## 2018-04-24 DIAGNOSIS — H26491 Other secondary cataract, right eye: Secondary | ICD-10-CM | POA: Diagnosis not present

## 2018-04-24 DIAGNOSIS — Z961 Presence of intraocular lens: Secondary | ICD-10-CM | POA: Diagnosis not present

## 2018-04-24 DIAGNOSIS — H401122 Primary open-angle glaucoma, left eye, moderate stage: Secondary | ICD-10-CM | POA: Diagnosis not present

## 2018-04-24 DIAGNOSIS — H401111 Primary open-angle glaucoma, right eye, mild stage: Secondary | ICD-10-CM | POA: Diagnosis not present

## 2018-05-02 ENCOUNTER — Encounter (INDEPENDENT_AMBULATORY_CARE_PROVIDER_SITE_OTHER): Payer: PPO | Admitting: Ophthalmology

## 2018-05-02 DIAGNOSIS — H35033 Hypertensive retinopathy, bilateral: Secondary | ICD-10-CM

## 2018-05-02 DIAGNOSIS — E11311 Type 2 diabetes mellitus with unspecified diabetic retinopathy with macular edema: Secondary | ICD-10-CM | POA: Diagnosis not present

## 2018-05-02 DIAGNOSIS — E113312 Type 2 diabetes mellitus with moderate nonproliferative diabetic retinopathy with macular edema, left eye: Secondary | ICD-10-CM

## 2018-05-02 DIAGNOSIS — I10 Essential (primary) hypertension: Secondary | ICD-10-CM

## 2018-05-02 DIAGNOSIS — E113391 Type 2 diabetes mellitus with moderate nonproliferative diabetic retinopathy without macular edema, right eye: Secondary | ICD-10-CM | POA: Diagnosis not present

## 2018-05-02 DIAGNOSIS — H34812 Central retinal vein occlusion, left eye, with macular edema: Secondary | ICD-10-CM | POA: Diagnosis not present

## 2018-05-02 DIAGNOSIS — H43813 Vitreous degeneration, bilateral: Secondary | ICD-10-CM

## 2018-06-13 ENCOUNTER — Encounter (INDEPENDENT_AMBULATORY_CARE_PROVIDER_SITE_OTHER): Payer: PPO | Admitting: Ophthalmology

## 2018-06-13 DIAGNOSIS — E113312 Type 2 diabetes mellitus with moderate nonproliferative diabetic retinopathy with macular edema, left eye: Secondary | ICD-10-CM

## 2018-06-13 DIAGNOSIS — E11311 Type 2 diabetes mellitus with unspecified diabetic retinopathy with macular edema: Secondary | ICD-10-CM

## 2018-06-13 DIAGNOSIS — H34812 Central retinal vein occlusion, left eye, with macular edema: Secondary | ICD-10-CM

## 2018-06-13 DIAGNOSIS — E113291 Type 2 diabetes mellitus with mild nonproliferative diabetic retinopathy without macular edema, right eye: Secondary | ICD-10-CM

## 2018-06-13 DIAGNOSIS — I10 Essential (primary) hypertension: Secondary | ICD-10-CM | POA: Diagnosis not present

## 2018-06-13 DIAGNOSIS — H43813 Vitreous degeneration, bilateral: Secondary | ICD-10-CM | POA: Diagnosis not present

## 2018-06-13 DIAGNOSIS — H35033 Hypertensive retinopathy, bilateral: Secondary | ICD-10-CM

## 2018-06-14 DIAGNOSIS — L814 Other melanin hyperpigmentation: Secondary | ICD-10-CM | POA: Diagnosis not present

## 2018-06-14 DIAGNOSIS — C44722 Squamous cell carcinoma of skin of right lower limb, including hip: Secondary | ICD-10-CM | POA: Diagnosis not present

## 2018-06-14 DIAGNOSIS — L578 Other skin changes due to chronic exposure to nonionizing radiation: Secondary | ICD-10-CM | POA: Diagnosis not present

## 2018-06-27 DIAGNOSIS — C44722 Squamous cell carcinoma of skin of right lower limb, including hip: Secondary | ICD-10-CM | POA: Diagnosis not present

## 2018-07-21 IMAGING — US US ABDOMEN COMPLETE
1 series · 14 of 25 positions shown · non-contrast
Comparison: 04/12/2016

CLINICAL DATA: Abnormal liver function.

EXAM:
ABDOMEN ULTRASOUND COMPLETE

[Series 1: us abdomen complete · 0.22mm/px · 14 of 79 slices shown]
[im 1/79]
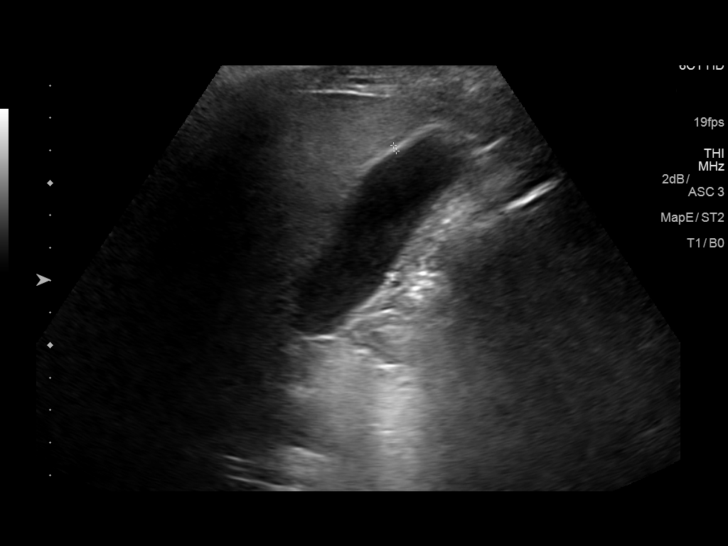
[im 7/79]
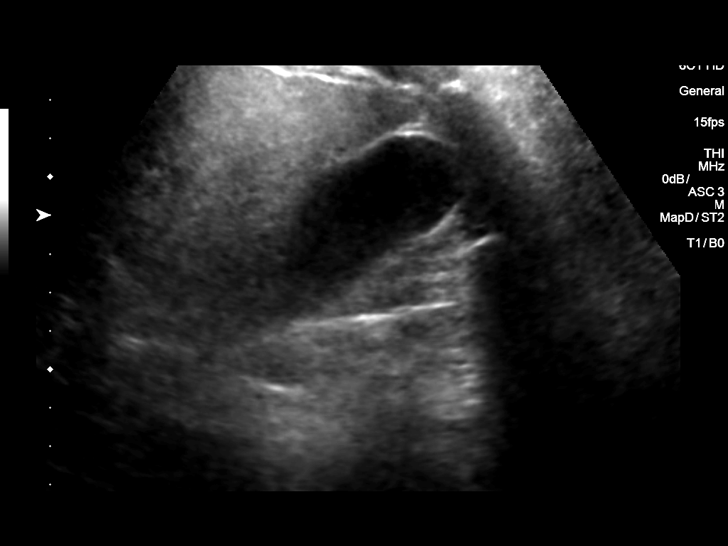
[im 14/79]
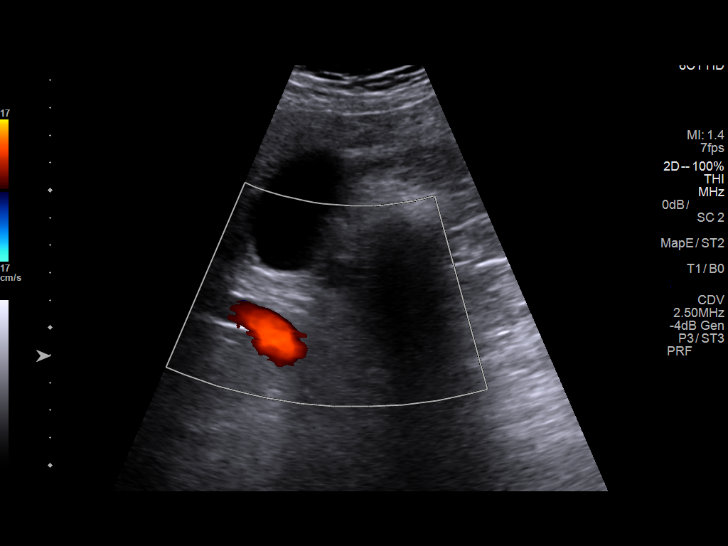
[im 20/79]
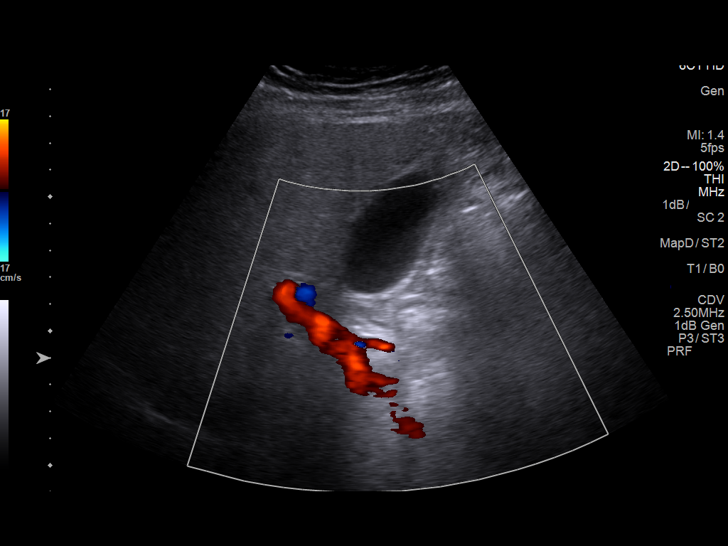
[im 27/79]
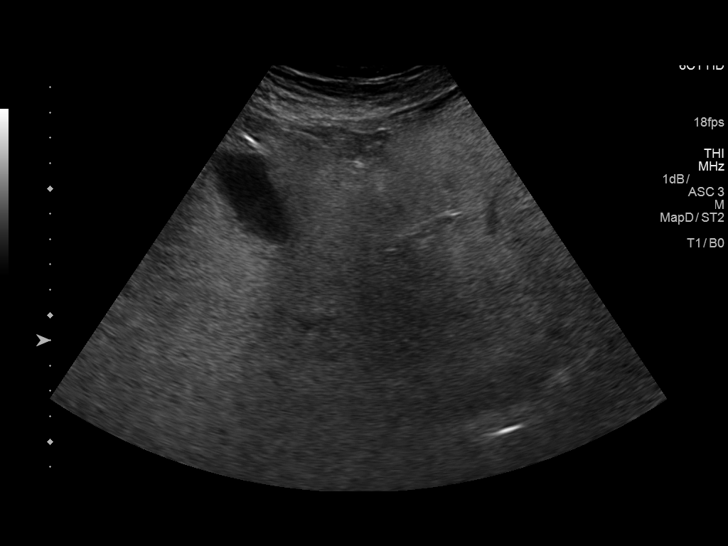
[im 30/79]
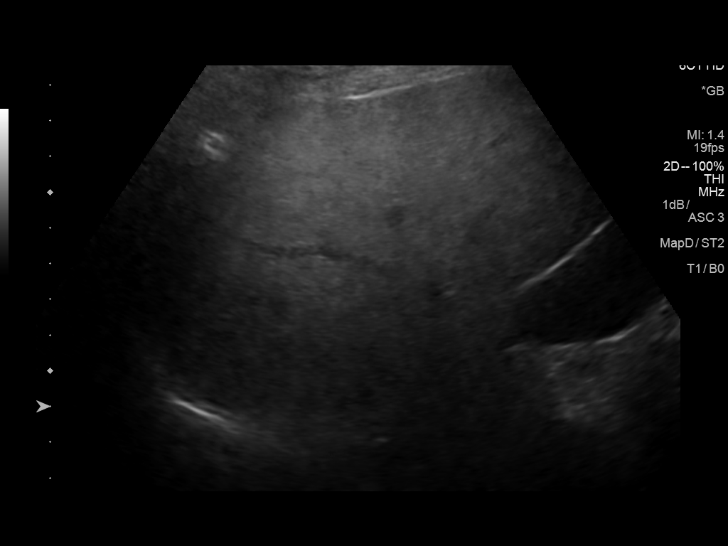
[im 36/79]
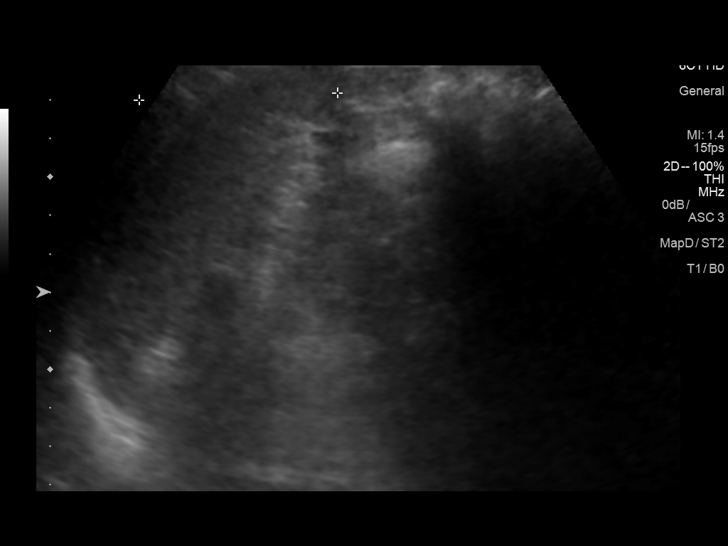
[im 43/79]
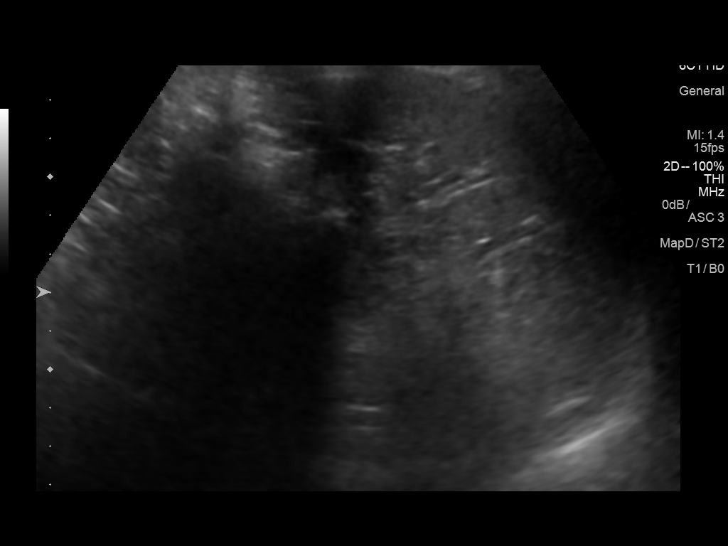
[im 49/79]
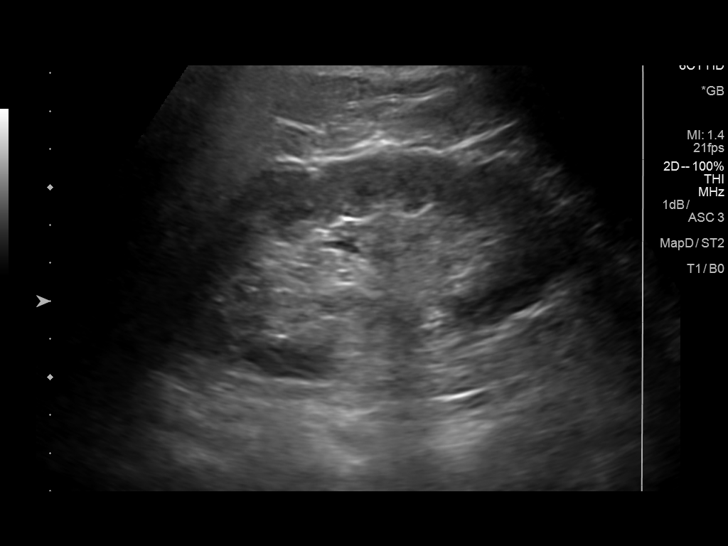
[im 53/79]
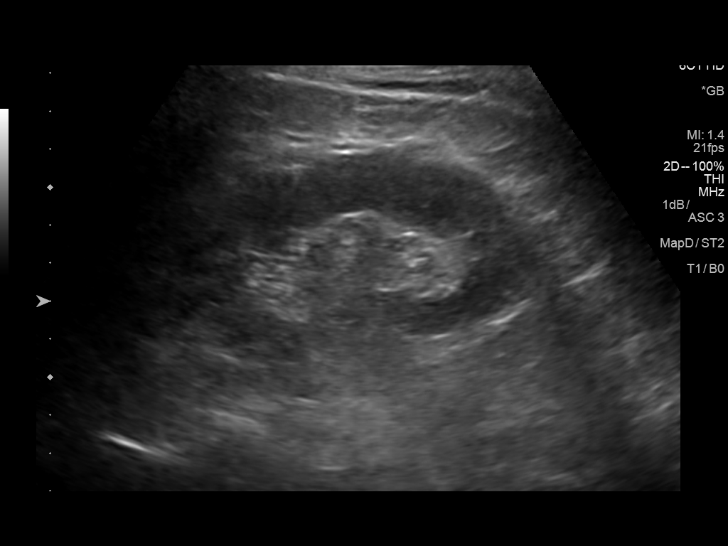
[im 59/79]
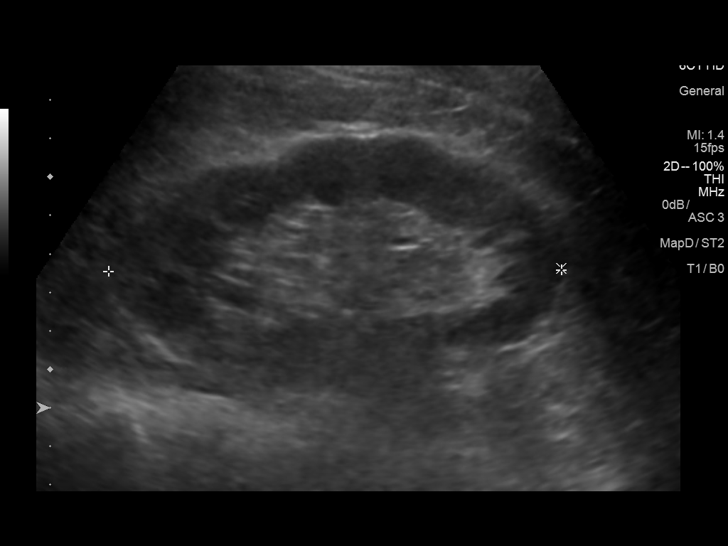
[im 66/79]
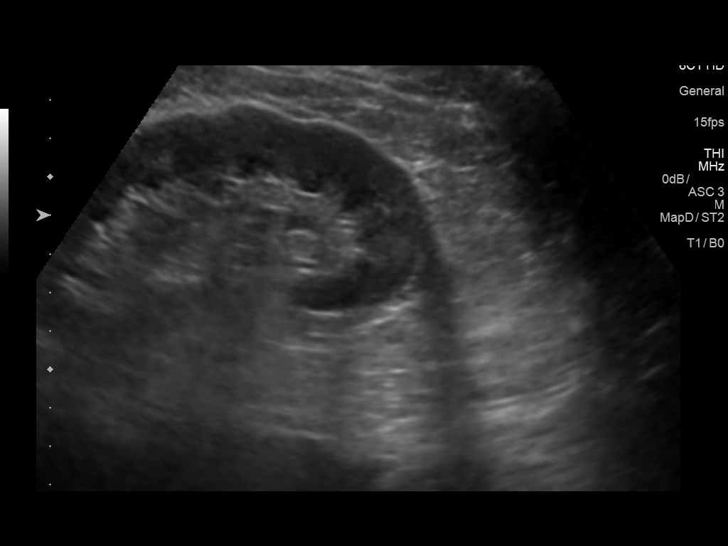
[im 72/79]
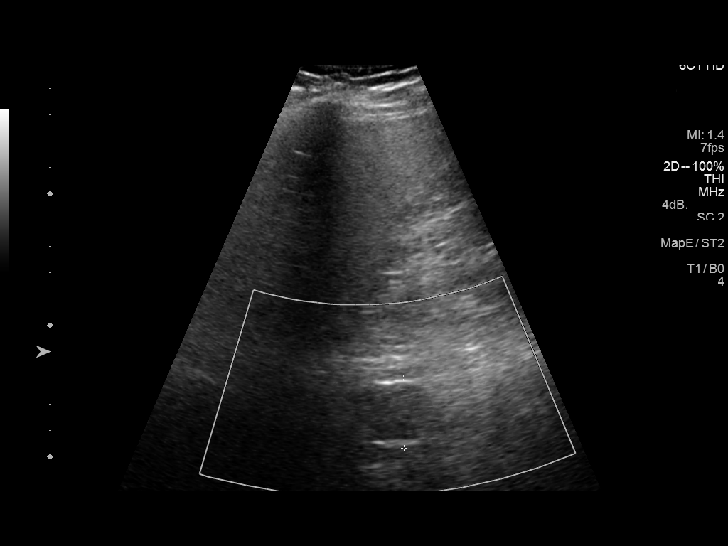
[im 79/79]
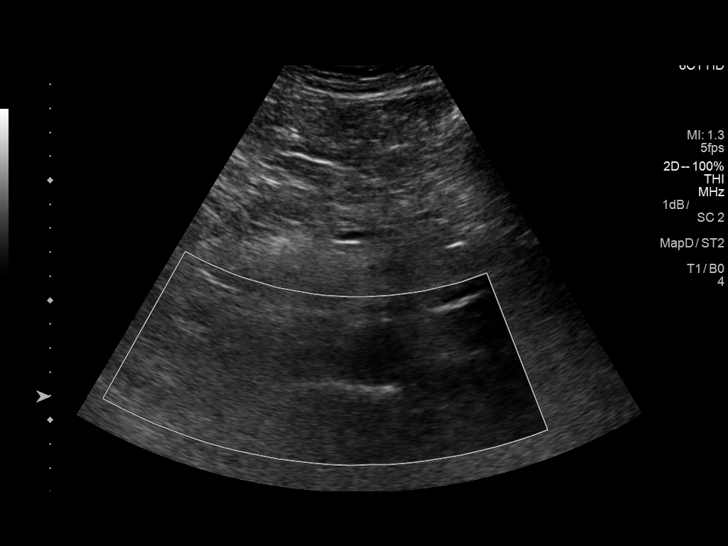

[14 of 25 positions shown; findings below may reference images not displayed]

FINDINGS: Gallbladder: No gallstones or wall thickening visualized. No
sonographic Murphy sign noted by sonographer.

Common bile duct: Diameter: 4 mm

Liver: Coarsened and echogenic diffusely. The surface of the liver
is subtly lobulated. Caudate lobe lobe size not established. No
evidence of mass. Antegrade flow in the main portal vein.

IVC: Nonvisualized

Pancreas: Not visualized

Spleen: Size and appearance within normal limits.

Right Kidney: Length: 11.4 cm. Echogenicity within normal limits. No
mass or hydronephrosis visualized.

Left Kidney: Length: 11.8 cm. Echogenicity within normal limits. No
mass or hydronephrosis visualized.

Abdominal aorta: No aneurysm visualized.

Other findings: None.
IMPRESSION: 1. Echogenic liver from steatosis or other hepatocellular disease.
Occasionally the liver surface appears lobulated, correlate for
cirrhosis risk factors.
2. Negative gallbladder.
3. Limited sonographic windows with nonvisualized IVC and pancreas.

## 2018-07-26 ENCOUNTER — Encounter (INDEPENDENT_AMBULATORY_CARE_PROVIDER_SITE_OTHER): Payer: PPO | Admitting: Ophthalmology

## 2018-07-26 DIAGNOSIS — I10 Essential (primary) hypertension: Secondary | ICD-10-CM | POA: Diagnosis not present

## 2018-07-26 DIAGNOSIS — H43813 Vitreous degeneration, bilateral: Secondary | ICD-10-CM

## 2018-07-26 DIAGNOSIS — H34812 Central retinal vein occlusion, left eye, with macular edema: Secondary | ICD-10-CM | POA: Diagnosis not present

## 2018-07-26 DIAGNOSIS — E113312 Type 2 diabetes mellitus with moderate nonproliferative diabetic retinopathy with macular edema, left eye: Secondary | ICD-10-CM

## 2018-07-26 DIAGNOSIS — H35033 Hypertensive retinopathy, bilateral: Secondary | ICD-10-CM | POA: Diagnosis not present

## 2018-07-26 DIAGNOSIS — E113291 Type 2 diabetes mellitus with mild nonproliferative diabetic retinopathy without macular edema, right eye: Secondary | ICD-10-CM

## 2018-07-26 DIAGNOSIS — E11311 Type 2 diabetes mellitus with unspecified diabetic retinopathy with macular edema: Secondary | ICD-10-CM

## 2018-08-16 DIAGNOSIS — Z23 Encounter for immunization: Secondary | ICD-10-CM | POA: Diagnosis not present

## 2018-08-16 DIAGNOSIS — R1033 Periumbilical pain: Secondary | ICD-10-CM | POA: Diagnosis not present

## 2018-08-16 DIAGNOSIS — Z8719 Personal history of other diseases of the digestive system: Secondary | ICD-10-CM | POA: Diagnosis not present

## 2018-08-16 DIAGNOSIS — K439 Ventral hernia without obstruction or gangrene: Secondary | ICD-10-CM | POA: Diagnosis not present

## 2018-08-30 DIAGNOSIS — R011 Cardiac murmur, unspecified: Secondary | ICD-10-CM | POA: Diagnosis not present

## 2018-08-30 DIAGNOSIS — K420 Umbilical hernia with obstruction, without gangrene: Secondary | ICD-10-CM | POA: Diagnosis not present

## 2018-08-30 DIAGNOSIS — Z8719 Personal history of other diseases of the digestive system: Secondary | ICD-10-CM | POA: Diagnosis not present

## 2018-08-30 DIAGNOSIS — Z9889 Other specified postprocedural states: Secondary | ICD-10-CM | POA: Diagnosis not present

## 2018-08-30 DIAGNOSIS — E119 Type 2 diabetes mellitus without complications: Secondary | ICD-10-CM | POA: Diagnosis not present

## 2018-09-04 DIAGNOSIS — E7800 Pure hypercholesterolemia, unspecified: Secondary | ICD-10-CM | POA: Diagnosis not present

## 2018-09-04 DIAGNOSIS — E118 Type 2 diabetes mellitus with unspecified complications: Secondary | ICD-10-CM | POA: Diagnosis not present

## 2018-09-04 DIAGNOSIS — I10 Essential (primary) hypertension: Secondary | ICD-10-CM | POA: Diagnosis not present

## 2018-09-06 ENCOUNTER — Encounter (INDEPENDENT_AMBULATORY_CARE_PROVIDER_SITE_OTHER): Payer: PPO | Admitting: Ophthalmology

## 2018-09-06 DIAGNOSIS — I10 Essential (primary) hypertension: Secondary | ICD-10-CM | POA: Diagnosis not present

## 2018-09-06 DIAGNOSIS — E113391 Type 2 diabetes mellitus with moderate nonproliferative diabetic retinopathy without macular edema, right eye: Secondary | ICD-10-CM | POA: Diagnosis not present

## 2018-09-06 DIAGNOSIS — E11311 Type 2 diabetes mellitus with unspecified diabetic retinopathy with macular edema: Secondary | ICD-10-CM | POA: Diagnosis not present

## 2018-09-06 DIAGNOSIS — H34812 Central retinal vein occlusion, left eye, with macular edema: Secondary | ICD-10-CM | POA: Diagnosis not present

## 2018-09-06 DIAGNOSIS — H35033 Hypertensive retinopathy, bilateral: Secondary | ICD-10-CM

## 2018-09-06 DIAGNOSIS — H43813 Vitreous degeneration, bilateral: Secondary | ICD-10-CM

## 2018-09-13 DIAGNOSIS — Z0000 Encounter for general adult medical examination without abnormal findings: Secondary | ICD-10-CM | POA: Diagnosis not present

## 2018-09-13 DIAGNOSIS — I519 Heart disease, unspecified: Secondary | ICD-10-CM | POA: Diagnosis not present

## 2018-09-13 DIAGNOSIS — E118 Type 2 diabetes mellitus with unspecified complications: Secondary | ICD-10-CM | POA: Diagnosis not present

## 2018-09-13 DIAGNOSIS — I10 Essential (primary) hypertension: Secondary | ICD-10-CM | POA: Diagnosis not present

## 2018-09-13 DIAGNOSIS — E7800 Pure hypercholesterolemia, unspecified: Secondary | ICD-10-CM | POA: Diagnosis not present

## 2018-10-08 DIAGNOSIS — R600 Localized edema: Secondary | ICD-10-CM | POA: Diagnosis not present

## 2018-10-08 DIAGNOSIS — I8310 Varicose veins of unspecified lower extremity with inflammation: Secondary | ICD-10-CM | POA: Diagnosis not present

## 2018-10-08 DIAGNOSIS — L02415 Cutaneous abscess of right lower limb: Secondary | ICD-10-CM | POA: Diagnosis not present

## 2018-10-08 DIAGNOSIS — C44722 Squamous cell carcinoma of skin of right lower limb, including hip: Secondary | ICD-10-CM | POA: Diagnosis not present

## 2018-10-18 ENCOUNTER — Encounter (INDEPENDENT_AMBULATORY_CARE_PROVIDER_SITE_OTHER): Payer: PPO | Admitting: Ophthalmology

## 2018-10-18 DIAGNOSIS — E11319 Type 2 diabetes mellitus with unspecified diabetic retinopathy without macular edema: Secondary | ICD-10-CM

## 2018-10-18 DIAGNOSIS — H35033 Hypertensive retinopathy, bilateral: Secondary | ICD-10-CM

## 2018-10-18 DIAGNOSIS — I10 Essential (primary) hypertension: Secondary | ICD-10-CM | POA: Diagnosis not present

## 2018-10-18 DIAGNOSIS — H34812 Central retinal vein occlusion, left eye, with macular edema: Secondary | ICD-10-CM | POA: Diagnosis not present

## 2018-10-18 DIAGNOSIS — E113291 Type 2 diabetes mellitus with mild nonproliferative diabetic retinopathy without macular edema, right eye: Secondary | ICD-10-CM | POA: Diagnosis not present

## 2018-10-18 DIAGNOSIS — H43813 Vitreous degeneration, bilateral: Secondary | ICD-10-CM | POA: Diagnosis not present

## 2018-10-23 DIAGNOSIS — H401123 Primary open-angle glaucoma, left eye, severe stage: Secondary | ICD-10-CM | POA: Diagnosis not present

## 2018-10-23 DIAGNOSIS — Z961 Presence of intraocular lens: Secondary | ICD-10-CM | POA: Diagnosis not present

## 2018-10-23 DIAGNOSIS — H348122 Central retinal vein occlusion, left eye, stable: Secondary | ICD-10-CM | POA: Diagnosis not present

## 2018-10-23 DIAGNOSIS — H401111 Primary open-angle glaucoma, right eye, mild stage: Secondary | ICD-10-CM | POA: Diagnosis not present

## 2018-10-24 DIAGNOSIS — L270 Generalized skin eruption due to drugs and medicaments taken internally: Secondary | ICD-10-CM | POA: Diagnosis not present

## 2018-10-24 DIAGNOSIS — C44722 Squamous cell carcinoma of skin of right lower limb, including hip: Secondary | ICD-10-CM | POA: Diagnosis not present

## 2018-11-22 ENCOUNTER — Encounter (INDEPENDENT_AMBULATORY_CARE_PROVIDER_SITE_OTHER): Payer: PPO | Admitting: Ophthalmology

## 2018-11-22 DIAGNOSIS — H35033 Hypertensive retinopathy, bilateral: Secondary | ICD-10-CM | POA: Diagnosis not present

## 2018-11-22 DIAGNOSIS — H34812 Central retinal vein occlusion, left eye, with macular edema: Secondary | ICD-10-CM | POA: Diagnosis not present

## 2018-11-22 DIAGNOSIS — I10 Essential (primary) hypertension: Secondary | ICD-10-CM

## 2018-11-22 DIAGNOSIS — E113291 Type 2 diabetes mellitus with mild nonproliferative diabetic retinopathy without macular edema, right eye: Secondary | ICD-10-CM

## 2018-11-22 DIAGNOSIS — E113312 Type 2 diabetes mellitus with moderate nonproliferative diabetic retinopathy with macular edema, left eye: Secondary | ICD-10-CM

## 2018-11-22 DIAGNOSIS — H43813 Vitreous degeneration, bilateral: Secondary | ICD-10-CM | POA: Diagnosis not present

## 2018-11-22 DIAGNOSIS — E11311 Type 2 diabetes mellitus with unspecified diabetic retinopathy with macular edema: Secondary | ICD-10-CM | POA: Diagnosis not present

## 2019-01-03 ENCOUNTER — Encounter (INDEPENDENT_AMBULATORY_CARE_PROVIDER_SITE_OTHER): Payer: PPO | Admitting: Ophthalmology

## 2019-01-03 DIAGNOSIS — H43813 Vitreous degeneration, bilateral: Secondary | ICD-10-CM | POA: Diagnosis not present

## 2019-01-03 DIAGNOSIS — E11319 Type 2 diabetes mellitus with unspecified diabetic retinopathy without macular edema: Secondary | ICD-10-CM

## 2019-01-03 DIAGNOSIS — E113391 Type 2 diabetes mellitus with moderate nonproliferative diabetic retinopathy without macular edema, right eye: Secondary | ICD-10-CM | POA: Diagnosis not present

## 2019-01-03 DIAGNOSIS — H34812 Central retinal vein occlusion, left eye, with macular edema: Secondary | ICD-10-CM

## 2019-01-22 DIAGNOSIS — D485 Neoplasm of uncertain behavior of skin: Secondary | ICD-10-CM | POA: Diagnosis not present

## 2019-01-22 DIAGNOSIS — L308 Other specified dermatitis: Secondary | ICD-10-CM | POA: Diagnosis not present

## 2019-01-22 DIAGNOSIS — L578 Other skin changes due to chronic exposure to nonionizing radiation: Secondary | ICD-10-CM | POA: Diagnosis not present

## 2019-01-22 DIAGNOSIS — L300 Nummular dermatitis: Secondary | ICD-10-CM | POA: Diagnosis not present

## 2019-01-23 DIAGNOSIS — H401132 Primary open-angle glaucoma, bilateral, moderate stage: Secondary | ICD-10-CM | POA: Diagnosis not present

## 2019-02-14 ENCOUNTER — Encounter (INDEPENDENT_AMBULATORY_CARE_PROVIDER_SITE_OTHER): Payer: PPO | Admitting: Ophthalmology

## 2019-02-14 ENCOUNTER — Other Ambulatory Visit: Payer: Self-pay

## 2019-02-14 DIAGNOSIS — H43813 Vitreous degeneration, bilateral: Secondary | ICD-10-CM

## 2019-02-14 DIAGNOSIS — E113391 Type 2 diabetes mellitus with moderate nonproliferative diabetic retinopathy without macular edema, right eye: Secondary | ICD-10-CM | POA: Diagnosis not present

## 2019-02-14 DIAGNOSIS — E11319 Type 2 diabetes mellitus with unspecified diabetic retinopathy without macular edema: Secondary | ICD-10-CM | POA: Diagnosis not present

## 2019-02-14 DIAGNOSIS — H34812 Central retinal vein occlusion, left eye, with macular edema: Secondary | ICD-10-CM

## 2019-03-14 DIAGNOSIS — E7800 Pure hypercholesterolemia, unspecified: Secondary | ICD-10-CM | POA: Diagnosis not present

## 2019-03-14 DIAGNOSIS — E119 Type 2 diabetes mellitus without complications: Secondary | ICD-10-CM | POA: Diagnosis not present

## 2019-03-14 DIAGNOSIS — R0902 Hypoxemia: Secondary | ICD-10-CM | POA: Diagnosis not present

## 2019-03-14 DIAGNOSIS — K7689 Other specified diseases of liver: Secondary | ICD-10-CM | POA: Diagnosis not present

## 2019-03-14 DIAGNOSIS — I10 Essential (primary) hypertension: Secondary | ICD-10-CM | POA: Diagnosis not present

## 2019-03-21 DIAGNOSIS — I519 Heart disease, unspecified: Secondary | ICD-10-CM | POA: Diagnosis not present

## 2019-03-21 DIAGNOSIS — R011 Cardiac murmur, unspecified: Secondary | ICD-10-CM | POA: Diagnosis not present

## 2019-03-21 DIAGNOSIS — E119 Type 2 diabetes mellitus without complications: Secondary | ICD-10-CM | POA: Diagnosis not present

## 2019-03-21 DIAGNOSIS — E7800 Pure hypercholesterolemia, unspecified: Secondary | ICD-10-CM | POA: Diagnosis not present

## 2019-03-21 DIAGNOSIS — I10 Essential (primary) hypertension: Secondary | ICD-10-CM | POA: Diagnosis not present

## 2019-03-28 ENCOUNTER — Other Ambulatory Visit: Payer: Self-pay

## 2019-03-28 ENCOUNTER — Encounter (INDEPENDENT_AMBULATORY_CARE_PROVIDER_SITE_OTHER): Payer: PPO | Admitting: Ophthalmology

## 2019-03-28 DIAGNOSIS — H34811 Central retinal vein occlusion, right eye, with macular edema: Secondary | ICD-10-CM

## 2019-03-28 DIAGNOSIS — E113212 Type 2 diabetes mellitus with mild nonproliferative diabetic retinopathy with macular edema, left eye: Secondary | ICD-10-CM | POA: Diagnosis not present

## 2019-03-28 DIAGNOSIS — E11311 Type 2 diabetes mellitus with unspecified diabetic retinopathy with macular edema: Secondary | ICD-10-CM | POA: Diagnosis not present

## 2019-03-28 DIAGNOSIS — H43813 Vitreous degeneration, bilateral: Secondary | ICD-10-CM

## 2019-03-28 DIAGNOSIS — E113291 Type 2 diabetes mellitus with mild nonproliferative diabetic retinopathy without macular edema, right eye: Secondary | ICD-10-CM

## 2019-05-09 ENCOUNTER — Encounter (INDEPENDENT_AMBULATORY_CARE_PROVIDER_SITE_OTHER): Payer: PPO | Admitting: Ophthalmology

## 2019-05-09 ENCOUNTER — Other Ambulatory Visit: Payer: Self-pay

## 2019-05-09 DIAGNOSIS — E11319 Type 2 diabetes mellitus with unspecified diabetic retinopathy without macular edema: Secondary | ICD-10-CM | POA: Diagnosis not present

## 2019-05-09 DIAGNOSIS — H34812 Central retinal vein occlusion, left eye, with macular edema: Secondary | ICD-10-CM

## 2019-05-09 DIAGNOSIS — H43813 Vitreous degeneration, bilateral: Secondary | ICD-10-CM

## 2019-05-09 DIAGNOSIS — E113291 Type 2 diabetes mellitus with mild nonproliferative diabetic retinopathy without macular edema, right eye: Secondary | ICD-10-CM | POA: Diagnosis not present

## 2019-06-20 ENCOUNTER — Encounter (INDEPENDENT_AMBULATORY_CARE_PROVIDER_SITE_OTHER): Payer: PPO | Admitting: Ophthalmology

## 2019-06-20 ENCOUNTER — Other Ambulatory Visit: Payer: Self-pay

## 2019-06-20 DIAGNOSIS — H34812 Central retinal vein occlusion, left eye, with macular edema: Secondary | ICD-10-CM | POA: Diagnosis not present

## 2019-06-20 DIAGNOSIS — H43813 Vitreous degeneration, bilateral: Secondary | ICD-10-CM | POA: Diagnosis not present

## 2019-06-27 DIAGNOSIS — R011 Cardiac murmur, unspecified: Secondary | ICD-10-CM | POA: Diagnosis not present

## 2019-06-27 DIAGNOSIS — E118 Type 2 diabetes mellitus with unspecified complications: Secondary | ICD-10-CM | POA: Diagnosis not present

## 2019-06-27 DIAGNOSIS — M542 Cervicalgia: Secondary | ICD-10-CM | POA: Diagnosis not present

## 2019-06-27 DIAGNOSIS — Z0000 Encounter for general adult medical examination without abnormal findings: Secondary | ICD-10-CM | POA: Diagnosis not present

## 2019-07-15 ENCOUNTER — Encounter (INDEPENDENT_AMBULATORY_CARE_PROVIDER_SITE_OTHER): Payer: PPO | Admitting: Ophthalmology

## 2019-07-23 DIAGNOSIS — Z961 Presence of intraocular lens: Secondary | ICD-10-CM | POA: Diagnosis not present

## 2019-07-23 DIAGNOSIS — H348122 Central retinal vein occlusion, left eye, stable: Secondary | ICD-10-CM | POA: Diagnosis not present

## 2019-07-23 DIAGNOSIS — H401123 Primary open-angle glaucoma, left eye, severe stage: Secondary | ICD-10-CM | POA: Diagnosis not present

## 2019-07-23 DIAGNOSIS — H401111 Primary open-angle glaucoma, right eye, mild stage: Secondary | ICD-10-CM | POA: Diagnosis not present

## 2019-07-23 DIAGNOSIS — E119 Type 2 diabetes mellitus without complications: Secondary | ICD-10-CM | POA: Diagnosis not present

## 2019-07-27 DIAGNOSIS — L299 Pruritus, unspecified: Secondary | ICD-10-CM | POA: Diagnosis not present

## 2019-07-27 DIAGNOSIS — L300 Nummular dermatitis: Secondary | ICD-10-CM | POA: Diagnosis not present

## 2019-07-27 DIAGNOSIS — L853 Xerosis cutis: Secondary | ICD-10-CM | POA: Diagnosis not present

## 2019-08-01 ENCOUNTER — Other Ambulatory Visit: Payer: Self-pay

## 2019-08-01 ENCOUNTER — Encounter (INDEPENDENT_AMBULATORY_CARE_PROVIDER_SITE_OTHER): Payer: PPO | Admitting: Ophthalmology

## 2019-08-01 DIAGNOSIS — H43813 Vitreous degeneration, bilateral: Secondary | ICD-10-CM

## 2019-08-01 DIAGNOSIS — E113291 Type 2 diabetes mellitus with mild nonproliferative diabetic retinopathy without macular edema, right eye: Secondary | ICD-10-CM | POA: Diagnosis not present

## 2019-08-01 DIAGNOSIS — H34812 Central retinal vein occlusion, left eye, with macular edema: Secondary | ICD-10-CM

## 2019-08-01 DIAGNOSIS — E113312 Type 2 diabetes mellitus with moderate nonproliferative diabetic retinopathy with macular edema, left eye: Secondary | ICD-10-CM | POA: Diagnosis not present

## 2019-08-01 DIAGNOSIS — E11319 Type 2 diabetes mellitus with unspecified diabetic retinopathy without macular edema: Secondary | ICD-10-CM | POA: Diagnosis not present

## 2019-09-12 DIAGNOSIS — E118 Type 2 diabetes mellitus with unspecified complications: Secondary | ICD-10-CM | POA: Diagnosis not present

## 2019-09-12 DIAGNOSIS — Z125 Encounter for screening for malignant neoplasm of prostate: Secondary | ICD-10-CM | POA: Diagnosis not present

## 2019-09-12 DIAGNOSIS — Z0000 Encounter for general adult medical examination without abnormal findings: Secondary | ICD-10-CM | POA: Diagnosis not present

## 2019-09-12 DIAGNOSIS — R011 Cardiac murmur, unspecified: Secondary | ICD-10-CM | POA: Diagnosis not present

## 2019-09-12 DIAGNOSIS — I10 Essential (primary) hypertension: Secondary | ICD-10-CM | POA: Diagnosis not present

## 2019-09-19 ENCOUNTER — Encounter (INDEPENDENT_AMBULATORY_CARE_PROVIDER_SITE_OTHER): Payer: PPO | Admitting: Ophthalmology

## 2019-09-19 DIAGNOSIS — H35033 Hypertensive retinopathy, bilateral: Secondary | ICD-10-CM

## 2019-09-19 DIAGNOSIS — H34812 Central retinal vein occlusion, left eye, with macular edema: Secondary | ICD-10-CM

## 2019-09-19 DIAGNOSIS — H43813 Vitreous degeneration, bilateral: Secondary | ICD-10-CM

## 2019-09-19 DIAGNOSIS — H211X2 Other vascular disorders of iris and ciliary body, left eye: Secondary | ICD-10-CM | POA: Diagnosis not present

## 2019-09-19 DIAGNOSIS — I10 Essential (primary) hypertension: Secondary | ICD-10-CM

## 2019-09-26 DIAGNOSIS — Z0000 Encounter for general adult medical examination without abnormal findings: Secondary | ICD-10-CM | POA: Diagnosis not present

## 2019-09-26 DIAGNOSIS — E7800 Pure hypercholesterolemia, unspecified: Secondary | ICD-10-CM | POA: Diagnosis not present

## 2019-09-26 DIAGNOSIS — E118 Type 2 diabetes mellitus with unspecified complications: Secondary | ICD-10-CM | POA: Diagnosis not present

## 2019-09-26 DIAGNOSIS — Q253 Supravalvular aortic stenosis: Secondary | ICD-10-CM | POA: Diagnosis not present

## 2019-10-03 ENCOUNTER — Encounter (INDEPENDENT_AMBULATORY_CARE_PROVIDER_SITE_OTHER): Payer: PPO | Admitting: Ophthalmology

## 2019-10-03 ENCOUNTER — Other Ambulatory Visit: Payer: Self-pay

## 2019-10-03 DIAGNOSIS — H34812 Central retinal vein occlusion, left eye, with macular edema: Secondary | ICD-10-CM

## 2019-10-08 DIAGNOSIS — L820 Inflamed seborrheic keratosis: Secondary | ICD-10-CM | POA: Diagnosis not present

## 2019-10-17 ENCOUNTER — Encounter (INDEPENDENT_AMBULATORY_CARE_PROVIDER_SITE_OTHER): Payer: PPO | Admitting: Ophthalmology

## 2019-10-18 ENCOUNTER — Encounter (INDEPENDENT_AMBULATORY_CARE_PROVIDER_SITE_OTHER): Payer: PPO | Admitting: Ophthalmology

## 2019-10-18 DIAGNOSIS — H35033 Hypertensive retinopathy, bilateral: Secondary | ICD-10-CM

## 2019-10-18 DIAGNOSIS — E11319 Type 2 diabetes mellitus with unspecified diabetic retinopathy without macular edema: Secondary | ICD-10-CM

## 2019-10-18 DIAGNOSIS — I10 Essential (primary) hypertension: Secondary | ICD-10-CM

## 2019-10-18 DIAGNOSIS — H43813 Vitreous degeneration, bilateral: Secondary | ICD-10-CM

## 2019-10-18 DIAGNOSIS — E113291 Type 2 diabetes mellitus with mild nonproliferative diabetic retinopathy without macular edema, right eye: Secondary | ICD-10-CM

## 2019-10-18 DIAGNOSIS — H34812 Central retinal vein occlusion, left eye, with macular edema: Secondary | ICD-10-CM | POA: Diagnosis not present

## 2019-11-15 ENCOUNTER — Other Ambulatory Visit: Payer: Self-pay

## 2019-11-15 ENCOUNTER — Encounter (INDEPENDENT_AMBULATORY_CARE_PROVIDER_SITE_OTHER): Payer: PPO | Admitting: Ophthalmology

## 2019-11-15 DIAGNOSIS — E11319 Type 2 diabetes mellitus with unspecified diabetic retinopathy without macular edema: Secondary | ICD-10-CM

## 2019-11-15 DIAGNOSIS — H43813 Vitreous degeneration, bilateral: Secondary | ICD-10-CM

## 2019-11-15 DIAGNOSIS — E113391 Type 2 diabetes mellitus with moderate nonproliferative diabetic retinopathy without macular edema, right eye: Secondary | ICD-10-CM

## 2019-11-15 DIAGNOSIS — H34812 Central retinal vein occlusion, left eye, with macular edema: Secondary | ICD-10-CM

## 2019-12-12 ENCOUNTER — Encounter (INDEPENDENT_AMBULATORY_CARE_PROVIDER_SITE_OTHER): Payer: PPO | Admitting: Ophthalmology

## 2019-12-12 DIAGNOSIS — E113291 Type 2 diabetes mellitus with mild nonproliferative diabetic retinopathy without macular edema, right eye: Secondary | ICD-10-CM

## 2019-12-12 DIAGNOSIS — H34812 Central retinal vein occlusion, left eye, with macular edema: Secondary | ICD-10-CM | POA: Diagnosis not present

## 2019-12-12 DIAGNOSIS — H43813 Vitreous degeneration, bilateral: Secondary | ICD-10-CM | POA: Diagnosis not present

## 2019-12-12 DIAGNOSIS — E11311 Type 2 diabetes mellitus with unspecified diabetic retinopathy with macular edema: Secondary | ICD-10-CM | POA: Diagnosis not present

## 2019-12-24 DIAGNOSIS — E118 Type 2 diabetes mellitus with unspecified complications: Secondary | ICD-10-CM | POA: Diagnosis not present

## 2019-12-30 DIAGNOSIS — E118 Type 2 diabetes mellitus with unspecified complications: Secondary | ICD-10-CM | POA: Diagnosis not present

## 2020-01-09 ENCOUNTER — Encounter (INDEPENDENT_AMBULATORY_CARE_PROVIDER_SITE_OTHER): Payer: PPO | Admitting: Ophthalmology

## 2020-01-13 ENCOUNTER — Encounter (INDEPENDENT_AMBULATORY_CARE_PROVIDER_SITE_OTHER): Payer: PPO | Admitting: Ophthalmology

## 2020-01-17 ENCOUNTER — Encounter (INDEPENDENT_AMBULATORY_CARE_PROVIDER_SITE_OTHER): Payer: PPO | Admitting: Ophthalmology

## 2020-01-21 ENCOUNTER — Encounter (INDEPENDENT_AMBULATORY_CARE_PROVIDER_SITE_OTHER): Payer: PPO | Admitting: Ophthalmology

## 2020-01-27 ENCOUNTER — Encounter (INDEPENDENT_AMBULATORY_CARE_PROVIDER_SITE_OTHER): Payer: PPO | Admitting: Ophthalmology

## 2020-01-27 ENCOUNTER — Other Ambulatory Visit: Payer: Self-pay

## 2020-01-27 DIAGNOSIS — E113291 Type 2 diabetes mellitus with mild nonproliferative diabetic retinopathy without macular edema, right eye: Secondary | ICD-10-CM | POA: Diagnosis not present

## 2020-01-27 DIAGNOSIS — H34812 Central retinal vein occlusion, left eye, with macular edema: Secondary | ICD-10-CM | POA: Diagnosis not present

## 2020-01-27 DIAGNOSIS — H43813 Vitreous degeneration, bilateral: Secondary | ICD-10-CM

## 2020-01-27 DIAGNOSIS — E11319 Type 2 diabetes mellitus with unspecified diabetic retinopathy without macular edema: Secondary | ICD-10-CM | POA: Diagnosis not present

## 2020-02-25 ENCOUNTER — Encounter (INDEPENDENT_AMBULATORY_CARE_PROVIDER_SITE_OTHER): Payer: PPO | Admitting: Ophthalmology

## 2020-02-25 DIAGNOSIS — E11319 Type 2 diabetes mellitus with unspecified diabetic retinopathy without macular edema: Secondary | ICD-10-CM

## 2020-02-25 DIAGNOSIS — H43813 Vitreous degeneration, bilateral: Secondary | ICD-10-CM | POA: Diagnosis not present

## 2020-02-25 DIAGNOSIS — E113291 Type 2 diabetes mellitus with mild nonproliferative diabetic retinopathy without macular edema, right eye: Secondary | ICD-10-CM

## 2020-02-25 DIAGNOSIS — H34812 Central retinal vein occlusion, left eye, with macular edema: Secondary | ICD-10-CM | POA: Diagnosis not present

## 2020-03-23 DIAGNOSIS — E118 Type 2 diabetes mellitus with unspecified complications: Secondary | ICD-10-CM | POA: Diagnosis not present

## 2020-03-24 ENCOUNTER — Encounter (INDEPENDENT_AMBULATORY_CARE_PROVIDER_SITE_OTHER): Payer: PPO | Admitting: Ophthalmology

## 2020-03-24 ENCOUNTER — Other Ambulatory Visit: Payer: Self-pay

## 2020-03-24 DIAGNOSIS — E11319 Type 2 diabetes mellitus with unspecified diabetic retinopathy without macular edema: Secondary | ICD-10-CM

## 2020-03-24 DIAGNOSIS — H34812 Central retinal vein occlusion, left eye, with macular edema: Secondary | ICD-10-CM

## 2020-03-24 DIAGNOSIS — H43813 Vitreous degeneration, bilateral: Secondary | ICD-10-CM

## 2020-03-24 DIAGNOSIS — E113291 Type 2 diabetes mellitus with mild nonproliferative diabetic retinopathy without macular edema, right eye: Secondary | ICD-10-CM

## 2020-03-30 DIAGNOSIS — R011 Cardiac murmur, unspecified: Secondary | ICD-10-CM | POA: Diagnosis not present

## 2020-03-30 DIAGNOSIS — I10 Essential (primary) hypertension: Secondary | ICD-10-CM | POA: Diagnosis not present

## 2020-03-30 DIAGNOSIS — I519 Heart disease, unspecified: Secondary | ICD-10-CM | POA: Diagnosis not present

## 2020-03-30 DIAGNOSIS — Q253 Supravalvular aortic stenosis: Secondary | ICD-10-CM | POA: Diagnosis not present

## 2020-03-30 DIAGNOSIS — E119 Type 2 diabetes mellitus without complications: Secondary | ICD-10-CM | POA: Diagnosis not present

## 2020-04-21 ENCOUNTER — Other Ambulatory Visit: Payer: Self-pay

## 2020-04-21 ENCOUNTER — Encounter (INDEPENDENT_AMBULATORY_CARE_PROVIDER_SITE_OTHER): Payer: PPO | Admitting: Ophthalmology

## 2020-04-21 DIAGNOSIS — E113391 Type 2 diabetes mellitus with moderate nonproliferative diabetic retinopathy without macular edema, right eye: Secondary | ICD-10-CM

## 2020-04-21 DIAGNOSIS — H34812 Central retinal vein occlusion, left eye, with macular edema: Secondary | ICD-10-CM | POA: Diagnosis not present

## 2020-04-21 DIAGNOSIS — H43813 Vitreous degeneration, bilateral: Secondary | ICD-10-CM

## 2020-04-21 DIAGNOSIS — E11319 Type 2 diabetes mellitus with unspecified diabetic retinopathy without macular edema: Secondary | ICD-10-CM

## 2020-04-30 DIAGNOSIS — E119 Type 2 diabetes mellitus without complications: Secondary | ICD-10-CM | POA: Diagnosis not present

## 2020-04-30 DIAGNOSIS — I10 Essential (primary) hypertension: Secondary | ICD-10-CM | POA: Diagnosis not present

## 2020-04-30 DIAGNOSIS — M25842 Other specified joint disorders, left hand: Secondary | ICD-10-CM | POA: Diagnosis not present

## 2020-04-30 DIAGNOSIS — R011 Cardiac murmur, unspecified: Secondary | ICD-10-CM | POA: Diagnosis not present

## 2020-04-30 DIAGNOSIS — I519 Heart disease, unspecified: Secondary | ICD-10-CM | POA: Diagnosis not present

## 2020-04-30 DIAGNOSIS — Q253 Supravalvular aortic stenosis: Secondary | ICD-10-CM | POA: Diagnosis not present

## 2020-05-19 ENCOUNTER — Encounter (INDEPENDENT_AMBULATORY_CARE_PROVIDER_SITE_OTHER): Payer: PPO | Admitting: Ophthalmology

## 2020-05-19 ENCOUNTER — Other Ambulatory Visit: Payer: Self-pay

## 2020-05-19 DIAGNOSIS — H43813 Vitreous degeneration, bilateral: Secondary | ICD-10-CM | POA: Diagnosis not present

## 2020-05-19 DIAGNOSIS — E113291 Type 2 diabetes mellitus with mild nonproliferative diabetic retinopathy without macular edema, right eye: Secondary | ICD-10-CM | POA: Diagnosis not present

## 2020-05-19 DIAGNOSIS — H34812 Central retinal vein occlusion, left eye, with macular edema: Secondary | ICD-10-CM | POA: Diagnosis not present

## 2020-05-19 DIAGNOSIS — E11319 Type 2 diabetes mellitus with unspecified diabetic retinopathy without macular edema: Secondary | ICD-10-CM

## 2020-05-21 DIAGNOSIS — M71349 Other bursal cyst, unspecified hand: Secondary | ICD-10-CM | POA: Diagnosis not present

## 2020-05-21 DIAGNOSIS — M71342 Other bursal cyst, left hand: Secondary | ICD-10-CM | POA: Diagnosis not present

## 2020-05-25 DIAGNOSIS — M67442 Ganglion, left hand: Secondary | ICD-10-CM | POA: Diagnosis not present

## 2020-05-25 DIAGNOSIS — R2232 Localized swelling, mass and lump, left upper limb: Secondary | ICD-10-CM | POA: Diagnosis not present

## 2020-06-05 DIAGNOSIS — L299 Pruritus, unspecified: Secondary | ICD-10-CM | POA: Diagnosis not present

## 2020-06-05 DIAGNOSIS — L853 Xerosis cutis: Secondary | ICD-10-CM | POA: Diagnosis not present

## 2020-06-05 DIAGNOSIS — L280 Lichen simplex chronicus: Secondary | ICD-10-CM | POA: Diagnosis not present

## 2020-06-05 DIAGNOSIS — L300 Nummular dermatitis: Secondary | ICD-10-CM | POA: Diagnosis not present

## 2020-06-23 ENCOUNTER — Encounter (INDEPENDENT_AMBULATORY_CARE_PROVIDER_SITE_OTHER): Payer: PPO | Admitting: Ophthalmology

## 2020-06-25 ENCOUNTER — Other Ambulatory Visit: Payer: Self-pay

## 2020-06-25 ENCOUNTER — Encounter (INDEPENDENT_AMBULATORY_CARE_PROVIDER_SITE_OTHER): Payer: PPO | Admitting: Ophthalmology

## 2020-06-25 DIAGNOSIS — H43813 Vitreous degeneration, bilateral: Secondary | ICD-10-CM | POA: Diagnosis not present

## 2020-06-25 DIAGNOSIS — E11319 Type 2 diabetes mellitus with unspecified diabetic retinopathy without macular edema: Secondary | ICD-10-CM | POA: Diagnosis not present

## 2020-06-25 DIAGNOSIS — H34812 Central retinal vein occlusion, left eye, with macular edema: Secondary | ICD-10-CM

## 2020-06-25 DIAGNOSIS — E113291 Type 2 diabetes mellitus with mild nonproliferative diabetic retinopathy without macular edema, right eye: Secondary | ICD-10-CM

## 2020-07-30 ENCOUNTER — Other Ambulatory Visit: Payer: Self-pay

## 2020-07-30 ENCOUNTER — Encounter (INDEPENDENT_AMBULATORY_CARE_PROVIDER_SITE_OTHER): Payer: PPO | Admitting: Ophthalmology

## 2020-07-30 DIAGNOSIS — H43813 Vitreous degeneration, bilateral: Secondary | ICD-10-CM | POA: Diagnosis not present

## 2020-07-30 DIAGNOSIS — E113291 Type 2 diabetes mellitus with mild nonproliferative diabetic retinopathy without macular edema, right eye: Secondary | ICD-10-CM

## 2020-07-30 DIAGNOSIS — H34812 Central retinal vein occlusion, left eye, with macular edema: Secondary | ICD-10-CM

## 2020-07-30 DIAGNOSIS — E11319 Type 2 diabetes mellitus with unspecified diabetic retinopathy without macular edema: Secondary | ICD-10-CM | POA: Diagnosis not present

## 2020-09-03 ENCOUNTER — Encounter (INDEPENDENT_AMBULATORY_CARE_PROVIDER_SITE_OTHER): Payer: PPO | Admitting: Ophthalmology

## 2020-09-03 ENCOUNTER — Other Ambulatory Visit: Payer: Self-pay

## 2020-09-03 DIAGNOSIS — H43813 Vitreous degeneration, bilateral: Secondary | ICD-10-CM

## 2020-09-03 DIAGNOSIS — H34812 Central retinal vein occlusion, left eye, with macular edema: Secondary | ICD-10-CM

## 2020-09-03 DIAGNOSIS — E113392 Type 2 diabetes mellitus with moderate nonproliferative diabetic retinopathy without macular edema, left eye: Secondary | ICD-10-CM

## 2020-09-03 DIAGNOSIS — E11319 Type 2 diabetes mellitus with unspecified diabetic retinopathy without macular edema: Secondary | ICD-10-CM | POA: Diagnosis not present

## 2020-10-01 DIAGNOSIS — Z0000 Encounter for general adult medical examination without abnormal findings: Secondary | ICD-10-CM | POA: Diagnosis not present

## 2020-10-01 DIAGNOSIS — E7800 Pure hypercholesterolemia, unspecified: Secondary | ICD-10-CM | POA: Diagnosis not present

## 2020-10-01 DIAGNOSIS — I10 Essential (primary) hypertension: Secondary | ICD-10-CM | POA: Diagnosis not present

## 2020-10-01 DIAGNOSIS — E119 Type 2 diabetes mellitus without complications: Secondary | ICD-10-CM | POA: Diagnosis not present

## 2020-10-07 ENCOUNTER — Other Ambulatory Visit: Payer: Self-pay

## 2020-10-07 ENCOUNTER — Encounter (INDEPENDENT_AMBULATORY_CARE_PROVIDER_SITE_OTHER): Payer: PPO | Admitting: Ophthalmology

## 2020-10-07 DIAGNOSIS — H34812 Central retinal vein occlusion, left eye, with macular edema: Secondary | ICD-10-CM | POA: Diagnosis not present

## 2020-10-07 DIAGNOSIS — E113291 Type 2 diabetes mellitus with mild nonproliferative diabetic retinopathy without macular edema, right eye: Secondary | ICD-10-CM

## 2020-10-07 DIAGNOSIS — E11319 Type 2 diabetes mellitus with unspecified diabetic retinopathy without macular edema: Secondary | ICD-10-CM | POA: Diagnosis not present

## 2020-10-07 DIAGNOSIS — H43813 Vitreous degeneration, bilateral: Secondary | ICD-10-CM | POA: Diagnosis not present

## 2020-10-08 ENCOUNTER — Encounter (INDEPENDENT_AMBULATORY_CARE_PROVIDER_SITE_OTHER): Payer: PPO | Admitting: Ophthalmology

## 2020-10-08 DIAGNOSIS — I519 Heart disease, unspecified: Secondary | ICD-10-CM | POA: Diagnosis not present

## 2020-10-08 DIAGNOSIS — M542 Cervicalgia: Secondary | ICD-10-CM | POA: Diagnosis not present

## 2020-10-08 DIAGNOSIS — E119 Type 2 diabetes mellitus without complications: Secondary | ICD-10-CM | POA: Diagnosis not present

## 2020-10-08 DIAGNOSIS — Z0000 Encounter for general adult medical examination without abnormal findings: Secondary | ICD-10-CM | POA: Diagnosis not present

## 2020-10-08 DIAGNOSIS — Q253 Supravalvular aortic stenosis: Secondary | ICD-10-CM | POA: Diagnosis not present

## 2020-10-08 DIAGNOSIS — R011 Cardiac murmur, unspecified: Secondary | ICD-10-CM | POA: Diagnosis not present

## 2020-10-08 DIAGNOSIS — I10 Essential (primary) hypertension: Secondary | ICD-10-CM | POA: Diagnosis not present

## 2020-10-08 DIAGNOSIS — L821 Other seborrheic keratosis: Secondary | ICD-10-CM | POA: Diagnosis not present

## 2020-10-08 DIAGNOSIS — E7800 Pure hypercholesterolemia, unspecified: Secondary | ICD-10-CM | POA: Diagnosis not present

## 2020-10-08 DIAGNOSIS — N529 Male erectile dysfunction, unspecified: Secondary | ICD-10-CM | POA: Diagnosis not present

## 2020-11-12 ENCOUNTER — Other Ambulatory Visit: Payer: Self-pay

## 2020-11-12 ENCOUNTER — Encounter (INDEPENDENT_AMBULATORY_CARE_PROVIDER_SITE_OTHER): Payer: PPO | Admitting: Ophthalmology

## 2020-11-12 DIAGNOSIS — E11319 Type 2 diabetes mellitus with unspecified diabetic retinopathy without macular edema: Secondary | ICD-10-CM | POA: Diagnosis not present

## 2020-11-12 DIAGNOSIS — H35033 Hypertensive retinopathy, bilateral: Secondary | ICD-10-CM | POA: Diagnosis not present

## 2020-11-12 DIAGNOSIS — I10 Essential (primary) hypertension: Secondary | ICD-10-CM

## 2020-11-12 DIAGNOSIS — E113291 Type 2 diabetes mellitus with mild nonproliferative diabetic retinopathy without macular edema, right eye: Secondary | ICD-10-CM | POA: Diagnosis not present

## 2020-11-12 DIAGNOSIS — H43813 Vitreous degeneration, bilateral: Secondary | ICD-10-CM | POA: Diagnosis not present

## 2020-11-12 DIAGNOSIS — H34812 Central retinal vein occlusion, left eye, with macular edema: Secondary | ICD-10-CM

## 2020-11-19 DIAGNOSIS — H401111 Primary open-angle glaucoma, right eye, mild stage: Secondary | ICD-10-CM | POA: Diagnosis not present

## 2020-11-19 DIAGNOSIS — E119 Type 2 diabetes mellitus without complications: Secondary | ICD-10-CM | POA: Diagnosis not present

## 2020-11-19 DIAGNOSIS — H401123 Primary open-angle glaucoma, left eye, severe stage: Secondary | ICD-10-CM | POA: Diagnosis not present

## 2020-12-17 ENCOUNTER — Other Ambulatory Visit: Payer: Self-pay

## 2020-12-17 ENCOUNTER — Encounter (INDEPENDENT_AMBULATORY_CARE_PROVIDER_SITE_OTHER): Payer: PPO | Admitting: Ophthalmology

## 2020-12-17 DIAGNOSIS — H43813 Vitreous degeneration, bilateral: Secondary | ICD-10-CM | POA: Diagnosis not present

## 2020-12-17 DIAGNOSIS — E113291 Type 2 diabetes mellitus with mild nonproliferative diabetic retinopathy without macular edema, right eye: Secondary | ICD-10-CM

## 2020-12-17 DIAGNOSIS — H34812 Central retinal vein occlusion, left eye, with macular edema: Secondary | ICD-10-CM | POA: Diagnosis not present

## 2021-01-21 ENCOUNTER — Other Ambulatory Visit: Payer: Self-pay

## 2021-01-21 ENCOUNTER — Encounter (INDEPENDENT_AMBULATORY_CARE_PROVIDER_SITE_OTHER): Payer: PPO | Admitting: Ophthalmology

## 2021-01-21 DIAGNOSIS — E113291 Type 2 diabetes mellitus with mild nonproliferative diabetic retinopathy without macular edema, right eye: Secondary | ICD-10-CM

## 2021-01-21 DIAGNOSIS — H43813 Vitreous degeneration, bilateral: Secondary | ICD-10-CM | POA: Diagnosis not present

## 2021-01-21 DIAGNOSIS — H34812 Central retinal vein occlusion, left eye, with macular edema: Secondary | ICD-10-CM | POA: Diagnosis not present

## 2021-03-04 ENCOUNTER — Other Ambulatory Visit: Payer: Self-pay

## 2021-03-04 ENCOUNTER — Encounter (INDEPENDENT_AMBULATORY_CARE_PROVIDER_SITE_OTHER): Payer: PPO | Admitting: Ophthalmology

## 2021-03-04 DIAGNOSIS — H43813 Vitreous degeneration, bilateral: Secondary | ICD-10-CM

## 2021-03-04 DIAGNOSIS — H34812 Central retinal vein occlusion, left eye, with macular edema: Secondary | ICD-10-CM | POA: Diagnosis not present

## 2021-03-10 ENCOUNTER — Encounter: Payer: Self-pay | Admitting: Podiatry

## 2021-03-10 ENCOUNTER — Other Ambulatory Visit: Payer: Self-pay

## 2021-03-10 ENCOUNTER — Ambulatory Visit (INDEPENDENT_AMBULATORY_CARE_PROVIDER_SITE_OTHER): Payer: PPO

## 2021-03-10 ENCOUNTER — Ambulatory Visit (INDEPENDENT_AMBULATORY_CARE_PROVIDER_SITE_OTHER): Payer: PPO | Admitting: Podiatry

## 2021-03-10 DIAGNOSIS — Z01818 Encounter for other preprocedural examination: Secondary | ICD-10-CM | POA: Diagnosis not present

## 2021-03-10 DIAGNOSIS — M2011 Hallux valgus (acquired), right foot: Secondary | ICD-10-CM | POA: Diagnosis not present

## 2021-03-10 NOTE — Progress Notes (Signed)
Subjective:  Patient ID: James Avery, male    DOB: [birth year],  MRN: [id number]  Chief Complaint  Patient presents with  . Bunions    Right foot bunion     87 y.o. male presents with the above complaint.  Patient presents with complaint of right foot bunion deformity.  Patient states that his pain very painful with ambulation.  He has tried shoe gear modification padding and protected none of which has helped.  He had seen someone in the past that he cannot recall.  He states that has been doing conservative treatment for a long time none of which has helped.  At this time he would like to discuss surgical options.  He states it hurts with ambulation hurts with different shoes.  Is been causing him discomfort for a year is dull achy in nature.  He denies any other acute complaints.  Review of Systems: Negative except as noted in the HPI. Denies N/V/F/Ch.  No past medical history on file.  Current Outpatient Medications:  .  glimepiride (AMARYL) 1 MG tablet, , Disp: , Rfl:  .  metFORMIN (GLUCOPHAGE-XR) 500 MG 24 hr tablet, , Disp: , Rfl:   Social History   Tobacco Use  Smoking Status Never Smoker  Smokeless Tobacco Never Used    No Known Allergies Objective:  There were no vitals filed for this visit. There is no height or weight on file to calculate BMI. Constitutional Well developed. Well nourished.  Vascular Dorsalis pedis pulses palpable bilaterally. Posterior tibial pulses palpable bilaterally. Capillary refill normal to all digits.  No cyanosis or clubbing noted. Pedal hair growth normal.  Neurologic Normal speech. Oriented to person, place, and time. Epicritic sensation to light touch grossly present bilaterally.  Dermatologic Nails well groomed and normal in appearance. No open wounds. No skin lesions.  Orthopedic: Normal joint ROM without pain or crepitus bilaterally. Hallux abductovalgus deformity present Left 1st MPJ diminished range of motion. Left 1st TMT  without gross hypermobility. Right 1st MPJ diminished range of motion  Right 1st TMT without gross hypermobility. Lesser digital contractures absent bilaterally.   Radiographs: Taken and reviewed. Hallux abductovalgus deformity present. Metatarsal parabola normal. 1st/2nd IMA: Increase to moderate; TSP: 5 out of 7.  There is a medial hypertrophy noted.  No arthritic changes noted at the first metatarsophalangeal joint.  There is increase in hallux abductor's angle  Assessment:   1. Hallux abducto valgus, right   2. Hallux interphalangeus, acquired, right    Plan:  Patient was evaluated and treated and all questions answered.  Hallux abductovalgus deformity right,  -XR as above discussed in extensive detail. -Patient has failed all conservative therapy and wishes to proceed with surgical intervention. All risks, benefits, and alternatives discussed with patient. No guarantees given. Consent reviewed and signed by patient. Post-op course explained at length. -Planned procedures: Right chevron osteotomy with screw fixation with possible phalangeal osteotomy -Explained to patient my above listed procedure and various treatment options were discussed.  I am I discussed my preoperative plan as well as my postoperative plan in extensive detail.  He will be weightbearing as tolerated in a cam boot. -Informed surgical risk consent was reviewed and read aloud to the patient.  I reviewed the films.  I have discussed my findings with the patient in great detail.  I have discussed all risks including but not limited to infection, stiffness, scarring, limp, disability, deformity, damage to blood vessels and nerves, numbness, poor healing, need for braces, arthritis, chronic pain,  amputation, death.  All benefits and realistic expectations discussed in great detail.  I have made no promises as to the outcome.  I have provided realistic expectations.  I have offered the patient a 2nd opinion, which they have  declined and assured me they preferred to proceed despite the risks -Cam boot was dispensed  No follow-ups on file.

## 2021-04-15 ENCOUNTER — Encounter (INDEPENDENT_AMBULATORY_CARE_PROVIDER_SITE_OTHER): Payer: PPO | Admitting: Ophthalmology

## 2021-04-15 ENCOUNTER — Other Ambulatory Visit: Payer: Self-pay

## 2021-04-15 DIAGNOSIS — E113291 Type 2 diabetes mellitus with mild nonproliferative diabetic retinopathy without macular edema, right eye: Secondary | ICD-10-CM

## 2021-04-15 DIAGNOSIS — H34812 Central retinal vein occlusion, left eye, with macular edema: Secondary | ICD-10-CM

## 2021-04-15 DIAGNOSIS — H43813 Vitreous degeneration, bilateral: Secondary | ICD-10-CM | POA: Diagnosis not present

## 2021-04-20 ENCOUNTER — Telehealth: Payer: PPO | Admitting: Urology

## 2021-04-20 NOTE — Telephone Encounter (Signed)
DOS - 05/10/21  [place] BUNIONECTOMY RIGHT --- 44975 DOUBLE OSTEOTOMY RIGHT --- 30051   HTA EFFECTIVE DATE - 09/05/03  RECEIVED FAX FROM [facility] AND IT STATS THAT CPT CODES 10211 AND 17356 HAVE BEEN APPROVED, [place] # [id number], GOOD FROM 05/10/21 - 08/08/21.

## 2021-05-06 DIAGNOSIS — L300 Nummular dermatitis: Secondary | ICD-10-CM | POA: Diagnosis not present

## 2021-05-10 ENCOUNTER — Other Ambulatory Visit: Payer: Self-pay | Admitting: Podiatry

## 2021-05-10 ENCOUNTER — Telehealth: Payer: Self-pay | Admitting: *Deleted

## 2021-05-10 ENCOUNTER — Encounter: Payer: Self-pay | Admitting: Podiatry

## 2021-05-10 DIAGNOSIS — M25571 Pain in right ankle and joints of right foot: Secondary | ICD-10-CM | POA: Diagnosis not present

## 2021-05-10 DIAGNOSIS — M21611 Bunion of right foot: Secondary | ICD-10-CM | POA: Diagnosis not present

## 2021-05-10 DIAGNOSIS — M2011 Hallux valgus (acquired), right foot: Secondary | ICD-10-CM | POA: Diagnosis not present

## 2021-05-10 DIAGNOSIS — M2041 Other hammer toe(s) (acquired), right foot: Secondary | ICD-10-CM | POA: Diagnosis not present

## 2021-05-10 MED ORDER — OXYCODONE-ACETAMINOPHEN 5-325 MG PO TABS: 1.0000 | ORAL_TABLET | ORAL | 0 refills | Status: DC | PRN

## 2021-05-10 MED ORDER — IBUPROFEN 800 MG PO TABS: 800.0000 mg | ORAL_TABLET | Freq: Four times a day (QID) | ORAL | 1 refills | Status: DC | PRN

## 2021-05-10 NOTE — Telephone Encounter (Signed)
Returned call to pharmacy and gave verbal ok to change prescription, verbalized understanding , will fax new prescription for signature.

## 2021-05-10 NOTE — Telephone Encounter (Signed)
That is fine. Let them know. Thank you

## 2021-05-10 NOTE — Telephone Encounter (Signed)
[facility] w/ [facility] is calling because there is a problem with a prescription sent,unable to fill. The patient has not had any opioids since 05/2020 and dosage is too high. They can modify and fill prescription for patient, 6 tablets daily,with a maximum of (77mme) 8 tablet daily.Please advise.

## 2021-05-19 ENCOUNTER — Encounter: Payer: PPO | Admitting: Podiatry

## 2021-05-19 DIAGNOSIS — H5440 Blindness, one eye, unspecified eye: Secondary | ICD-10-CM | POA: Diagnosis not present

## 2021-05-19 DIAGNOSIS — D513 Other dietary vitamin B12 deficiency anemia: Secondary | ICD-10-CM | POA: Diagnosis not present

## 2021-05-19 DIAGNOSIS — E119 Type 2 diabetes mellitus without complications: Secondary | ICD-10-CM | POA: Diagnosis not present

## 2021-05-19 DIAGNOSIS — E782 Mixed hyperlipidemia: Secondary | ICD-10-CM | POA: Diagnosis not present

## 2021-05-19 DIAGNOSIS — D500 Iron deficiency anemia secondary to blood loss (chronic): Secondary | ICD-10-CM | POA: Diagnosis not present

## 2021-05-19 DIAGNOSIS — D649 Anemia, unspecified: Secondary | ICD-10-CM | POA: Diagnosis not present

## 2021-05-19 DIAGNOSIS — R011 Cardiac murmur, unspecified: Secondary | ICD-10-CM | POA: Diagnosis not present

## 2021-05-21 ENCOUNTER — Ambulatory Visit (INDEPENDENT_AMBULATORY_CARE_PROVIDER_SITE_OTHER): Payer: PPO | Admitting: Podiatry

## 2021-05-21 ENCOUNTER — Other Ambulatory Visit: Payer: Self-pay

## 2021-05-21 ENCOUNTER — Ambulatory Visit (INDEPENDENT_AMBULATORY_CARE_PROVIDER_SITE_OTHER): Payer: PPO

## 2021-05-21 DIAGNOSIS — M2011 Hallux valgus (acquired), right foot: Secondary | ICD-10-CM

## 2021-05-21 DIAGNOSIS — Z9889 Other specified postprocedural states: Secondary | ICD-10-CM

## 2021-05-25 ENCOUNTER — Encounter: Payer: Self-pay | Admitting: Podiatry

## 2021-05-25 NOTE — Progress Notes (Signed)
  Subjective:  Patient ID: James Avery, male    DOB: 04/01/1934,  MRN: [id number]  Chief Complaint  Patient presents with   Routine Post Op    POS-DOS 6.6.22     DOS: 05/10/2021 Procedure: Right correction of bunion deformity  87 y.o. male returns for post-op check.  Patient states that he is doing well.  He has mild pain.  He has been ambulating with a cam boot.  He denies any other acute complaints.  Healing well.  Dressings clean dry and intact  Review of Systems: Negative except as noted in the HPI. Denies N/V/F/Ch.  No past medical history on file.  Current Outpatient Medications:    glimepiride (AMARYL) 1 MG tablet, , Disp: , Rfl:    ibuprofen (ADVIL) 800 MG tablet, Take 1 tablet (800 mg total) by mouth every 6 (six) hours as needed., Disp: 60 tablet, Rfl: 1   metFORMIN (GLUCOPHAGE-XR) 500 MG 24 hr tablet, , Disp: , Rfl:    oxyCODONE-acetaminophen (PERCOCET) 5-325 MG tablet, Take 1-2 tablets by mouth every 4 (four) hours as needed for severe pain., Disp: 30 tablet, Rfl: 0  Social History   Tobacco Use  Smoking Status Never  Smokeless Tobacco Never    No Known Allergies Objective:  There were no vitals filed for this visit. There is no height or weight on file to calculate BMI. Constitutional Well developed. Well nourished.  Vascular Foot warm and well perfused. Capillary refill normal to all digits.   Neurologic Normal speech. Oriented to person, place, and time. Epicritic sensation to light touch grossly present bilaterally.  Dermatologic Skin healing well without signs of infection. Skin edges well coapted without signs of infection.  Orthopedic: Tenderness to palpation noted about the surgical site.   Radiographs: 3 views of skeletally mature adult right foot:Return of sesamoid position noted.  Screw hardware intact.  No signs of backing out or complication noted. Assessment:   1. Hallux abducto valgus, right   2. Status post foot surgery    Plan:  Patient  was evaluated and treated and all questions answered.  S/p foot surgery right -Progressing as expected post-operatively. -XR: See above -WB Status: Weightbearing as tolerated in cam boot -Sutures: Intact.  No clinical signs of dehiscence noted.  No complication noted. -Medications: None -Foot redressed.  No follow-ups on file.

## 2021-05-27 ENCOUNTER — Encounter (INDEPENDENT_AMBULATORY_CARE_PROVIDER_SITE_OTHER): Payer: PPO | Admitting: Ophthalmology

## 2021-05-27 ENCOUNTER — Other Ambulatory Visit: Payer: Self-pay

## 2021-05-27 DIAGNOSIS — H43813 Vitreous degeneration, bilateral: Secondary | ICD-10-CM

## 2021-05-27 DIAGNOSIS — H34812 Central retinal vein occlusion, left eye, with macular edema: Secondary | ICD-10-CM | POA: Diagnosis not present

## 2021-05-27 DIAGNOSIS — E113291 Type 2 diabetes mellitus with mild nonproliferative diabetic retinopathy without macular edema, right eye: Secondary | ICD-10-CM

## 2021-05-28 DIAGNOSIS — Z888 Allergy status to other drugs, medicaments and biological substances status: Secondary | ICD-10-CM | POA: Diagnosis not present

## 2021-05-28 DIAGNOSIS — D500 Iron deficiency anemia secondary to blood loss (chronic): Secondary | ICD-10-CM | POA: Diagnosis not present

## 2021-05-28 DIAGNOSIS — E782 Mixed hyperlipidemia: Secondary | ICD-10-CM | POA: Diagnosis not present

## 2021-06-02 ENCOUNTER — Ambulatory Visit (INDEPENDENT_AMBULATORY_CARE_PROVIDER_SITE_OTHER): Payer: PPO | Admitting: Podiatry

## 2021-06-02 ENCOUNTER — Other Ambulatory Visit: Payer: Self-pay

## 2021-06-02 ENCOUNTER — Encounter: Payer: Self-pay | Admitting: Podiatry

## 2021-06-02 DIAGNOSIS — Z9889 Other specified postprocedural states: Secondary | ICD-10-CM

## 2021-06-02 DIAGNOSIS — M2011 Hallux valgus (acquired), right foot: Secondary | ICD-10-CM

## 2021-06-02 NOTE — Progress Notes (Signed)
  Subjective:  Patient ID: James Avery, male    DOB: March 12, 1934,  MRN: [id number]  Chief Complaint  Patient presents with   Routine Post Op    POV #2 DOS 05/10/2021 RT CORRECTION OF BUNION W/USE OF FIXATION    DOS: 05/10/2021 Procedure: Right correction of bunion deformity  87 y.o. male returns for post-op check.  Patient states that he is doing well.  He has mild pain.  He has been ambulating with a cam boot.  He denies any other acute complaints.  Healing well.  Dressings clean dry and intact  Review of Systems: Negative except as noted in the HPI. Denies N/V/F/Ch.  No past medical history on file.  Current Outpatient Medications:    glimepiride (AMARYL) 1 MG tablet, , Disp: , Rfl:    ibuprofen (ADVIL) 800 MG tablet, Take 1 tablet (800 mg total) by mouth every 6 (six) hours as needed., Disp: 60 tablet, Rfl: 1   metFORMIN (GLUCOPHAGE-XR) 500 MG 24 hr tablet, , Disp: , Rfl:    oxyCODONE-acetaminophen (PERCOCET) 5-325 MG tablet, Take 1-2 tablets by mouth every 4 (four) hours as needed for severe pain., Disp: 30 tablet, Rfl: 0  Social History   Tobacco Use  Smoking Status Never  Smokeless Tobacco Never    No Known Allergies Objective:  There were no vitals filed for this visit. There is no height or weight on file to calculate BMI. Constitutional Well developed. Well nourished.  Vascular Foot warm and well perfused. Capillary refill normal to all digits.   Neurologic Normal speech. Oriented to person, place, and time. Epicritic sensation to light touch grossly present bilaterally.  Dermatologic Distinctively every epithelialized.  Good range of motion noted at the first metatarsophalangeal joint.  No crepitus noted.  Orthopedic: No tenderness to palpation noted about the surgical site.   Radiographs: 3 views of skeletally mature adult right foot:Return of sesamoid position noted.  Screw hardware intact.  No signs of backing out or complication noted. Assessment:   1.  Hallux abducto valgus, right   2. Status post foot surgery     Plan:  Patient was evaluated and treated and all questions answered.  S/p foot surgery right -Progressing as expected post-operatively. -XR: See above -WB Status: Weightbearing as tolerated in regular shoes -Sutures: Removed no clinical signs of dehiscence noted.  No complication noted. -Medications: None -Foot redressed.  No follow-ups on file.

## 2021-06-03 DIAGNOSIS — D500 Iron deficiency anemia secondary to blood loss (chronic): Secondary | ICD-10-CM | POA: Diagnosis not present

## 2021-07-01 ENCOUNTER — Other Ambulatory Visit: Payer: Self-pay

## 2021-07-01 ENCOUNTER — Encounter (INDEPENDENT_AMBULATORY_CARE_PROVIDER_SITE_OTHER): Payer: PPO | Admitting: Ophthalmology

## 2021-07-01 DIAGNOSIS — H43813 Vitreous degeneration, bilateral: Secondary | ICD-10-CM | POA: Diagnosis not present

## 2021-07-01 DIAGNOSIS — H34812 Central retinal vein occlusion, left eye, with macular edema: Secondary | ICD-10-CM

## 2021-07-14 ENCOUNTER — Other Ambulatory Visit: Payer: Self-pay

## 2021-07-14 ENCOUNTER — Ambulatory Visit (INDEPENDENT_AMBULATORY_CARE_PROVIDER_SITE_OTHER): Payer: PPO | Admitting: Podiatry

## 2021-07-14 DIAGNOSIS — Z9889 Other specified postprocedural states: Secondary | ICD-10-CM

## 2021-07-14 DIAGNOSIS — M2011 Hallux valgus (acquired), right foot: Secondary | ICD-10-CM

## 2021-07-16 NOTE — Progress Notes (Signed)
  Subjective:  Patient ID: James Avery, male    DOB: 21-Jul-1934,  MRN: [id number]  Chief Complaint  Patient presents with   Routine Post Op    POV #2 DOS 05/10/2021 RT CORRECTION OF BUNION W/USE OF FIXATION    DOS: 05/10/2021 Procedure: Right correction of bunion deformity  87 y.o. male returns for post-op check.  Patient states that he is doing well.  He has mild pain.  He is ambulating in regular shoes.  Denies any other acute complaints  Review of Systems: Negative except as noted in the HPI. Denies N/V/F/Ch.  No past medical history on file.  Current Outpatient Medications:    glimepiride (AMARYL) 1 MG tablet, , Disp: , Rfl:    ibuprofen (ADVIL) 800 MG tablet, Take 1 tablet (800 mg total) by mouth every 6 (six) hours as needed., Disp: 60 tablet, Rfl: 1   metFORMIN (GLUCOPHAGE-XR) 500 MG 24 hr tablet, , Disp: , Rfl:    oxyCODONE-acetaminophen (PERCOCET) 5-325 MG tablet, Take 1-2 tablets by mouth every 4 (four) hours as needed for severe pain., Disp: 30 tablet, Rfl: 0  Social History   Tobacco Use  Smoking Status Never  Smokeless Tobacco Never    No Known Allergies Objective:  There were no vitals filed for this visit. There is no height or weight on file to calculate BMI. Constitutional Well developed. Well nourished.  Vascular Foot warm and well perfused. Capillary refill normal to all digits.   Neurologic Normal speech. Oriented to person, place, and time. Epicritic sensation to light touch grossly present bilaterally.  Dermatologic Distinctively every epithelialized.  Good range of motion noted at the first metatarsophalangeal joint.  No crepitus noted.  Orthopedic: No tenderness to palpation noted about the surgical site.   Radiographs: 3 views of skeletally mature adult right foot:Return of sesamoid position noted.  Screw hardware intact.  No signs of backing out or complication noted. Assessment:   No diagnosis found.   Plan:  Patient was evaluated and  treated and all questions answered.  S/p foot surgery right -Progressing as expected post-operatively. -XR: See above -WB Status: Weightbearing as tolerated in regular shoes -Sutures: Removed no clinical signs of dehiscence noted.  No complication noted. -Medications: None -Encourage increased motion at the first metatarsophalangeal joint.  Patient states understanding will do so.  No follow-ups on file.

## 2021-07-30 DIAGNOSIS — R011 Cardiac murmur, unspecified: Secondary | ICD-10-CM | POA: Diagnosis not present

## 2021-07-30 DIAGNOSIS — D500 Iron deficiency anemia secondary to blood loss (chronic): Secondary | ICD-10-CM | POA: Diagnosis not present

## 2021-07-30 DIAGNOSIS — D513 Other dietary vitamin B12 deficiency anemia: Secondary | ICD-10-CM | POA: Diagnosis not present

## 2021-08-12 ENCOUNTER — Encounter (INDEPENDENT_AMBULATORY_CARE_PROVIDER_SITE_OTHER): Payer: PPO | Admitting: Ophthalmology

## 2021-08-12 ENCOUNTER — Other Ambulatory Visit: Payer: Self-pay

## 2021-08-12 DIAGNOSIS — H34812 Central retinal vein occlusion, left eye, with macular edema: Secondary | ICD-10-CM | POA: Diagnosis not present

## 2021-08-12 DIAGNOSIS — H43813 Vitreous degeneration, bilateral: Secondary | ICD-10-CM | POA: Diagnosis not present

## 2021-08-12 DIAGNOSIS — E113291 Type 2 diabetes mellitus with mild nonproliferative diabetic retinopathy without macular edema, right eye: Secondary | ICD-10-CM

## 2021-09-09 ENCOUNTER — Other Ambulatory Visit: Payer: Self-pay

## 2021-09-09 ENCOUNTER — Encounter (INDEPENDENT_AMBULATORY_CARE_PROVIDER_SITE_OTHER): Payer: PPO | Admitting: Ophthalmology

## 2021-09-09 DIAGNOSIS — H43813 Vitreous degeneration, bilateral: Secondary | ICD-10-CM | POA: Diagnosis not present

## 2021-09-09 DIAGNOSIS — H34812 Central retinal vein occlusion, left eye, with macular edema: Secondary | ICD-10-CM | POA: Diagnosis not present

## 2021-10-14 ENCOUNTER — Other Ambulatory Visit: Payer: Self-pay

## 2021-10-14 ENCOUNTER — Encounter (INDEPENDENT_AMBULATORY_CARE_PROVIDER_SITE_OTHER): Payer: PPO | Admitting: Ophthalmology

## 2021-10-14 DIAGNOSIS — H43813 Vitreous degeneration, bilateral: Secondary | ICD-10-CM | POA: Diagnosis not present

## 2021-10-14 DIAGNOSIS — H34812 Central retinal vein occlusion, left eye, with macular edema: Secondary | ICD-10-CM

## 2021-10-14 DIAGNOSIS — E113391 Type 2 diabetes mellitus with moderate nonproliferative diabetic retinopathy without macular edema, right eye: Secondary | ICD-10-CM

## 2021-11-16 ENCOUNTER — Encounter (INDEPENDENT_AMBULATORY_CARE_PROVIDER_SITE_OTHER): Payer: PPO | Admitting: Ophthalmology

## 2021-11-18 ENCOUNTER — Encounter (INDEPENDENT_AMBULATORY_CARE_PROVIDER_SITE_OTHER): Payer: PPO | Admitting: Ophthalmology

## 2021-11-18 ENCOUNTER — Other Ambulatory Visit: Payer: Self-pay

## 2021-11-18 DIAGNOSIS — H43813 Vitreous degeneration, bilateral: Secondary | ICD-10-CM

## 2021-11-18 DIAGNOSIS — E113391 Type 2 diabetes mellitus with moderate nonproliferative diabetic retinopathy without macular edema, right eye: Secondary | ICD-10-CM

## 2021-11-18 DIAGNOSIS — H34812 Central retinal vein occlusion, left eye, with macular edema: Secondary | ICD-10-CM | POA: Diagnosis not present

## 2021-11-24 DIAGNOSIS — Z961 Presence of intraocular lens: Secondary | ICD-10-CM | POA: Diagnosis not present

## 2021-11-24 DIAGNOSIS — H348122 Central retinal vein occlusion, left eye, stable: Secondary | ICD-10-CM | POA: Diagnosis not present

## 2021-11-24 DIAGNOSIS — E119 Type 2 diabetes mellitus without complications: Secondary | ICD-10-CM | POA: Diagnosis not present

## 2021-11-24 DIAGNOSIS — H401123 Primary open-angle glaucoma, left eye, severe stage: Secondary | ICD-10-CM | POA: Diagnosis not present

## 2021-11-24 DIAGNOSIS — H401111 Primary open-angle glaucoma, right eye, mild stage: Secondary | ICD-10-CM | POA: Diagnosis not present

## 2021-12-21 ENCOUNTER — Encounter (INDEPENDENT_AMBULATORY_CARE_PROVIDER_SITE_OTHER): Payer: PPO | Admitting: Ophthalmology

## 2021-12-21 ENCOUNTER — Other Ambulatory Visit: Payer: Self-pay

## 2021-12-21 DIAGNOSIS — H43813 Vitreous degeneration, bilateral: Secondary | ICD-10-CM | POA: Diagnosis not present

## 2021-12-21 DIAGNOSIS — E113391 Type 2 diabetes mellitus with moderate nonproliferative diabetic retinopathy without macular edema, right eye: Secondary | ICD-10-CM

## 2021-12-21 DIAGNOSIS — H34812 Central retinal vein occlusion, left eye, with macular edema: Secondary | ICD-10-CM

## 2022-01-21 ENCOUNTER — Encounter: Payer: Self-pay | Admitting: Podiatry

## 2022-01-21 ENCOUNTER — Other Ambulatory Visit: Payer: Self-pay

## 2022-01-21 ENCOUNTER — Ambulatory Visit: Payer: PPO | Admitting: Podiatry

## 2022-01-21 DIAGNOSIS — Q828 Other specified congenital malformations of skin: Secondary | ICD-10-CM

## 2022-01-21 NOTE — Progress Notes (Signed)
°  Subjective:  Patient ID: James Avery, male    DOB: [birth year],  MRN: [id number]  Chief Complaint  Patient presents with   Callouses    Left foot     88 y.o. male presents with the above complaint.  Patient presents with left submetatarsal 5 porokeratotic lesion with underlying plantarflexed metatarsal.  Patient states painful to touch painful to walk on.  It does not hurt when he is wearing shoes hurts when he is ambulating barefooted.  He has not seen anyone else prior to seeing me.  He denies any other acute complaints.  He would like to discuss treatment options for this.   Review of Systems: Negative except as noted in the HPI. Denies N/V/F/Ch.  No past medical history on file.  Current Outpatient Medications:    glimepiride (AMARYL) 1 MG tablet, , Disp: , Rfl:    ibuprofen (ADVIL) 800 MG tablet, Take 1 tablet (800 mg total) by mouth every 6 (six) hours as needed., Disp: 60 tablet, Rfl: 1   metFORMIN (GLUCOPHAGE-XR) 500 MG 24 hr tablet, , Disp: , Rfl:    oxyCODONE-acetaminophen (PERCOCET) 5-325 MG tablet, Take 1-2 tablets by mouth every 4 (four) hours as needed for severe pain., Disp: 30 tablet, Rfl: 0  Social History   Tobacco Use  Smoking Status Never  Smokeless Tobacco Never    No Known Allergies Objective:  There were no vitals filed for this visit. There is no height or weight on file to calculate BMI. Constitutional Well developed. Well nourished.  Vascular Dorsalis pedis pulses palpable bilaterally. Posterior tibial pulses palpable bilaterally. Capillary refill normal to all digits.  No cyanosis or clubbing noted. Pedal hair growth normal.  Neurologic Normal speech. Oriented to person, place, and time. Epicritic sensation to light touch grossly present bilaterally.  Dermatologic Hyperkeratotic lesion noted with underlying submetatarsal 5 porokeratotic lesion.  Central nucleated core noted.  Plantarflexed fifth metatarsal noted.  Decreasing plantar fat pad  noted.  Orthopedic: Normal joint ROM without pain or crepitus bilaterally. No visible deformities. No bony tenderness.   Radiographs: None Assessment:   1. Porokeratosis    Plan:  Patient was evaluated and treated and all questions answered.  Left submetatarsal 5 porokeratosis -I explained to the patient the etiology of porokeratosis and worse treatment options were extensively discussed.  Given the amount of pain that he is having I believe patient will benefit from debridement of the lesion -Using chisel blade to handle the lesion was debrided down to healthy striated tissue.  No complication noted no pinpoint bleeding noted  No follow-ups on file.

## 2022-01-27 ENCOUNTER — Encounter (INDEPENDENT_AMBULATORY_CARE_PROVIDER_SITE_OTHER): Payer: PPO | Admitting: Ophthalmology

## 2022-01-27 ENCOUNTER — Other Ambulatory Visit: Payer: Self-pay

## 2022-01-27 DIAGNOSIS — E113291 Type 2 diabetes mellitus with mild nonproliferative diabetic retinopathy without macular edema, right eye: Secondary | ICD-10-CM | POA: Diagnosis not present

## 2022-01-27 DIAGNOSIS — H43813 Vitreous degeneration, bilateral: Secondary | ICD-10-CM

## 2022-01-27 DIAGNOSIS — H34812 Central retinal vein occlusion, left eye, with macular edema: Secondary | ICD-10-CM | POA: Diagnosis not present

## 2022-03-03 ENCOUNTER — Encounter (INDEPENDENT_AMBULATORY_CARE_PROVIDER_SITE_OTHER): Payer: PPO | Admitting: Ophthalmology

## 2022-03-03 DIAGNOSIS — H34812 Central retinal vein occlusion, left eye, with macular edema: Secondary | ICD-10-CM

## 2022-03-03 DIAGNOSIS — E113391 Type 2 diabetes mellitus with moderate nonproliferative diabetic retinopathy without macular edema, right eye: Secondary | ICD-10-CM | POA: Diagnosis not present

## 2022-03-03 DIAGNOSIS — H43813 Vitreous degeneration, bilateral: Secondary | ICD-10-CM

## 2022-03-09 ENCOUNTER — Ambulatory Visit: Payer: PPO | Admitting: Podiatry

## 2022-03-09 ENCOUNTER — Ambulatory Visit: Payer: PPO

## 2022-03-09 DIAGNOSIS — Q666 Other congenital valgus deformities of feet: Secondary | ICD-10-CM

## 2022-03-09 DIAGNOSIS — Z9889 Other specified postprocedural states: Secondary | ICD-10-CM

## 2022-03-09 DIAGNOSIS — M2011 Hallux valgus (acquired), right foot: Secondary | ICD-10-CM

## 2022-03-09 NOTE — Progress Notes (Signed)
SITUATION ?Reason for Consult: Evaluation for Bilateral Custom Foot Orthoses ?Patient / Caregiver Report: Patient is ready for foot orthotics ? ?OBJECTIVE DATA: ?Patient History / Diagnosis:  ?  ICD-10-CM   ?1. Hallux abducto valgus, right  M20.11   ?  ?2. Status post foot surgery  Z98.890   ?  ?3. Hallux interphalangeus, acquired, right  M20.11   ?  ? ? ?Current or Previous Devices:   None and no history ? ?Foot Examination: ?Skin presentation:   Post-op ?Ulcers & Callousing:   None ?Toe / Foot Deformities:  Hallux valgus ?Weight Bearing Presentation:  Rectus ?Sensation:    Intact ? ?Shoe Size:    8.11M ? ?ORTHOTIC RECOMMENDATION ?Recommended Device: 1x pair of custom functional foot orthotics ? ?GOALS OF ORTHOSES ?- Reduce Pain ?- Prevent Foot Deformity ?- Prevent Progression of Further Foot Deformity ?- Relieve Pressure ?- Improve the Overall Biomechanical Function of the Foot and Lower Extremity. ? ?ACTIONS PERFORMED ?Potential out of pocket cost was communicated to patient. Patient understood and consent to casting. Patient was casted for Foot Orthoses via crush box. Procedure was explained and patient tolerated procedure well. Casts were shipped to central fabrication. All questions were answered and concerns addressed. ? ?PLAN ?Patient is to be called for fitting when devices are ready.  ? ? ?

## 2022-03-15 NOTE — Progress Notes (Signed)
?  Subjective:  ?Patient ID: James Avery, male    DOB: 02-Nov-1934,  MRN: [id number] ? ?Chief Complaint  ?Patient presents with  ? Foot Pain  ? ? ?88 y.o. male presents with the above complaint.  Patient presents with left submetatarsal 5 porokeratotic lesion with underlying plantarflexed metatarsal.  Patient states he keeps coming back and is hurting him.  He is doing great he does not have much pain anymore.  Debridement helps.  He would like to discuss more preventive technique with insoles. ? ? ?Review of Systems: Negative except as noted in the HPI. Denies N/V/F/Ch. ? ?No past medical history on file. ? ?Current Outpatient Medications:  ?  glimepiride (AMARYL) 1 MG tablet, , Disp: , Rfl:  ?  ibuprofen (ADVIL) 800 MG tablet, Take 1 tablet (800 mg total) by mouth every 6 (six) hours as needed., Disp: 60 tablet, Rfl: 1 ?  metFORMIN (GLUCOPHAGE-XR) 500 MG 24 hr tablet, , Disp: , Rfl:  ?  oxyCODONE-acetaminophen (PERCOCET) 5-325 MG tablet, Take 1-2 tablets by mouth every 4 (four) hours as needed for severe pain., Disp: 30 tablet, Rfl: 0 ? ?Social History  ? ?Tobacco Use  ?Smoking Status Never  ?Smokeless Tobacco Never  ? ? ?No Known Allergies ?Objective:  ?There were no vitals filed for this visit. ?There is no height or weight on file to calculate BMI. ?Constitutional Well developed. ?Well nourished.  ?Vascular Dorsalis pedis pulses palpable bilaterally. ?Posterior tibial pulses palpable bilaterally. ?Capillary refill normal to all digits.  ?No cyanosis or clubbing noted. ?Pedal hair growth normal.  ?Neurologic Normal speech. ?Oriented to person, place, and time. ?Epicritic sensation to light touch grossly present bilaterally.  ?Dermatologic Hyperkeratotic lesion noted with underlying submetatarsal 5 porokeratotic lesion.  Central nucleated core noted.  Plantarflexed fifth metatarsal noted.  Decreasing plantar fat pad noted.  Gait examination shows pes planovalgus foot structure with calcaneovalgus to many toe  signs unable to recruit the arch with dorsiflexion of the hallux.  Unable to perform single and double heel raise.  ?Orthopedic: Normal joint ROM without pain or crepitus bilaterally. ?No visible deformities. ?No bony tenderness.  ? ?Radiographs: None ?Assessment:  ? ?1. Pes planovalgus   ? ? ?Plan:  ?Patient was evaluated and treated and all questions answered. ? ?Left submetatarsal 5 porokeratosis ?-I explained to the patient the etiology of porokeratosis and worse treatment options were extensively discussed.  Given the amount of pain that he is having I believe patient will benefit from debridement of the lesion ?-Using chisel blade to handle the lesion was debrided down to healthy striated tissue.  No complication noted no pinpoint bleeding noted ? ?Pes planovalgus ?-I explained to patient the etiology of pes planovalgus and relationship with submetatarsal 5 and various treatment options were discussed.  Given patient foot structure in the setting of Planter fasciitis I believe patient will benefit from custom-made orthotics to help control the hindfoot motion support the arch of the foot and take the stress away from plantar fascial.  Patient agrees with the plan like to proceed with orthotics ?-Patient was casted for orthotics with offloading of bilateral submetatarsal 5 ? ? ?No follow-ups on file. ?

## 2022-04-01 ENCOUNTER — Ambulatory Visit (INDEPENDENT_AMBULATORY_CARE_PROVIDER_SITE_OTHER): Payer: PPO

## 2022-04-01 DIAGNOSIS — Z9889 Other specified postprocedural states: Secondary | ICD-10-CM

## 2022-04-01 DIAGNOSIS — Q666 Other congenital valgus deformities of feet: Secondary | ICD-10-CM

## 2022-04-01 DIAGNOSIS — M2011 Hallux valgus (acquired), right foot: Secondary | ICD-10-CM

## 2022-04-01 NOTE — Progress Notes (Signed)
SITUATION: ?Reason for Visit: Fitting and Delivery of Custom Fabricated Foot Orthoses ?Patient Report: Patient reports comfort and is satisfied with device. ? ?OBJECTIVE DATA: ?Patient History / Diagnosis:   ?  ICD-10-CM   ?1. Hallux abducto valgus, right  M20.11   ?  ?2. Status post foot surgery  Z98.890   ?  ?3. Hallux interphalangeus, acquired, right  M20.11   ?  ?4. Pes planovalgus  Q66.6   ?  ? ? ?Provided Device:  Custom Functional Foot Orthotics ?    [facility]: [id number] ? ? ?GOAL OF ORTHOSIS ?- Improve gait ?- Decrease energy expenditure ?- Improve Balance ?- Provide Triplanar stability of foot complex ?- Facilitate motion ? ?ACTIONS PERFORMED ?Patient was fit with foot orthotics trimmed to shoe last. Patient tolerated fittign procedure.  ? ?Patient was provided with verbal and written instruction and demonstration regarding donning, doffing, wear, care, proper fit, function, purpose, cleaning, and use of the orthosis and in all related precautions and risks and benefits regarding the orthosis. ? ?Patient was also provided with verbal instruction regarding how to report any failures or malfunctions of the orthosis and necessary follow up care. Patient was also instructed to contact our office regarding any change in status that may affect the function of the orthosis. ? ?Patient demonstrated independence with proper donning, doffing, and fit and verbalized understanding of all instructions. ? ?PLAN: ?Patient is to follow up in one week or as necessary (PRN). All questions were answered and concerns addressed. Plan of care was discussed with and agreed upon by the patient. ? ?

## 2022-04-07 ENCOUNTER — Encounter (INDEPENDENT_AMBULATORY_CARE_PROVIDER_SITE_OTHER): Payer: PPO | Admitting: Ophthalmology

## 2022-04-07 DIAGNOSIS — E113391 Type 2 diabetes mellitus with moderate nonproliferative diabetic retinopathy without macular edema, right eye: Secondary | ICD-10-CM

## 2022-04-07 DIAGNOSIS — H43813 Vitreous degeneration, bilateral: Secondary | ICD-10-CM | POA: Diagnosis not present

## 2022-04-07 DIAGNOSIS — H34812 Central retinal vein occlusion, left eye, with macular edema: Secondary | ICD-10-CM

## 2022-05-12 ENCOUNTER — Encounter (INDEPENDENT_AMBULATORY_CARE_PROVIDER_SITE_OTHER): Payer: PPO | Admitting: Ophthalmology

## 2022-05-12 DIAGNOSIS — H35033 Hypertensive retinopathy, bilateral: Secondary | ICD-10-CM

## 2022-05-12 DIAGNOSIS — H34812 Central retinal vein occlusion, left eye, with macular edema: Secondary | ICD-10-CM | POA: Diagnosis not present

## 2022-05-12 DIAGNOSIS — H43813 Vitreous degeneration, bilateral: Secondary | ICD-10-CM | POA: Diagnosis not present

## 2022-05-12 DIAGNOSIS — E113391 Type 2 diabetes mellitus with moderate nonproliferative diabetic retinopathy without macular edema, right eye: Secondary | ICD-10-CM | POA: Diagnosis not present

## 2022-05-12 DIAGNOSIS — I10 Essential (primary) hypertension: Secondary | ICD-10-CM | POA: Diagnosis not present

## 2022-06-16 ENCOUNTER — Encounter (INDEPENDENT_AMBULATORY_CARE_PROVIDER_SITE_OTHER): Payer: PPO | Admitting: Ophthalmology

## 2022-06-16 DIAGNOSIS — H35033 Hypertensive retinopathy, bilateral: Secondary | ICD-10-CM | POA: Diagnosis not present

## 2022-06-16 DIAGNOSIS — E113391 Type 2 diabetes mellitus with moderate nonproliferative diabetic retinopathy without macular edema, right eye: Secondary | ICD-10-CM | POA: Diagnosis not present

## 2022-06-16 DIAGNOSIS — H43813 Vitreous degeneration, bilateral: Secondary | ICD-10-CM | POA: Diagnosis not present

## 2022-06-16 DIAGNOSIS — H34812 Central retinal vein occlusion, left eye, with macular edema: Secondary | ICD-10-CM

## 2022-06-16 DIAGNOSIS — I10 Essential (primary) hypertension: Secondary | ICD-10-CM | POA: Diagnosis not present

## 2022-07-28 ENCOUNTER — Encounter (INDEPENDENT_AMBULATORY_CARE_PROVIDER_SITE_OTHER): Payer: PPO | Admitting: Ophthalmology

## 2022-07-28 DIAGNOSIS — H43813 Vitreous degeneration, bilateral: Secondary | ICD-10-CM | POA: Diagnosis not present

## 2022-07-28 DIAGNOSIS — E113391 Type 2 diabetes mellitus with moderate nonproliferative diabetic retinopathy without macular edema, right eye: Secondary | ICD-10-CM

## 2022-07-28 DIAGNOSIS — H34812 Central retinal vein occlusion, left eye, with macular edema: Secondary | ICD-10-CM

## 2022-09-01 ENCOUNTER — Encounter (INDEPENDENT_AMBULATORY_CARE_PROVIDER_SITE_OTHER): Payer: PPO | Admitting: Ophthalmology

## 2022-09-01 DIAGNOSIS — H43813 Vitreous degeneration, bilateral: Secondary | ICD-10-CM

## 2022-09-01 DIAGNOSIS — E113391 Type 2 diabetes mellitus with moderate nonproliferative diabetic retinopathy without macular edema, right eye: Secondary | ICD-10-CM | POA: Diagnosis not present

## 2022-09-01 DIAGNOSIS — H34812 Central retinal vein occlusion, left eye, with macular edema: Secondary | ICD-10-CM

## 2022-10-13 ENCOUNTER — Encounter (INDEPENDENT_AMBULATORY_CARE_PROVIDER_SITE_OTHER): Payer: PPO | Admitting: Ophthalmology

## 2022-10-13 DIAGNOSIS — H34812 Central retinal vein occlusion, left eye, with macular edema: Secondary | ICD-10-CM

## 2022-10-13 DIAGNOSIS — H43813 Vitreous degeneration, bilateral: Secondary | ICD-10-CM

## 2022-10-13 DIAGNOSIS — E113391 Type 2 diabetes mellitus with moderate nonproliferative diabetic retinopathy without macular edema, right eye: Secondary | ICD-10-CM | POA: Diagnosis not present

## 2022-11-24 ENCOUNTER — Encounter (INDEPENDENT_AMBULATORY_CARE_PROVIDER_SITE_OTHER): Payer: PPO | Admitting: Ophthalmology

## 2022-11-24 DIAGNOSIS — E113391 Type 2 diabetes mellitus with moderate nonproliferative diabetic retinopathy without macular edema, right eye: Secondary | ICD-10-CM

## 2022-11-24 DIAGNOSIS — H43813 Vitreous degeneration, bilateral: Secondary | ICD-10-CM

## 2022-11-24 DIAGNOSIS — H34812 Central retinal vein occlusion, left eye, with macular edema: Secondary | ICD-10-CM | POA: Diagnosis not present

## 2023-01-05 ENCOUNTER — Encounter (INDEPENDENT_AMBULATORY_CARE_PROVIDER_SITE_OTHER): Payer: Medicare HMO | Admitting: Ophthalmology

## 2023-01-05 DIAGNOSIS — H34812 Central retinal vein occlusion, left eye, with macular edema: Secondary | ICD-10-CM

## 2023-01-05 DIAGNOSIS — H43813 Vitreous degeneration, bilateral: Secondary | ICD-10-CM

## 2023-01-05 DIAGNOSIS — E113391 Type 2 diabetes mellitus with moderate nonproliferative diabetic retinopathy without macular edema, right eye: Secondary | ICD-10-CM | POA: Diagnosis not present

## 2023-01-10 ENCOUNTER — Encounter (INDEPENDENT_AMBULATORY_CARE_PROVIDER_SITE_OTHER): Payer: Medicare HMO | Admitting: Ophthalmology

## 2023-01-10 DIAGNOSIS — H34812 Central retinal vein occlusion, left eye, with macular edema: Secondary | ICD-10-CM | POA: Diagnosis not present

## 2023-02-20 ENCOUNTER — Encounter (INDEPENDENT_AMBULATORY_CARE_PROVIDER_SITE_OTHER): Payer: Medicare HMO | Admitting: Ophthalmology

## 2023-02-20 DIAGNOSIS — H43813 Vitreous degeneration, bilateral: Secondary | ICD-10-CM | POA: Diagnosis not present

## 2023-02-20 DIAGNOSIS — H34812 Central retinal vein occlusion, left eye, with macular edema: Secondary | ICD-10-CM | POA: Diagnosis not present

## 2023-02-20 DIAGNOSIS — E113391 Type 2 diabetes mellitus with moderate nonproliferative diabetic retinopathy without macular edema, right eye: Secondary | ICD-10-CM | POA: Diagnosis not present

## 2023-03-30 ENCOUNTER — Encounter (INDEPENDENT_AMBULATORY_CARE_PROVIDER_SITE_OTHER): Payer: Medicare HMO | Admitting: Ophthalmology

## 2023-03-30 DIAGNOSIS — Z7984 Long term (current) use of oral hypoglycemic drugs: Secondary | ICD-10-CM | POA: Diagnosis not present

## 2023-03-30 DIAGNOSIS — E113291 Type 2 diabetes mellitus with mild nonproliferative diabetic retinopathy without macular edema, right eye: Secondary | ICD-10-CM | POA: Diagnosis not present

## 2023-03-30 DIAGNOSIS — H43813 Vitreous degeneration, bilateral: Secondary | ICD-10-CM | POA: Diagnosis not present

## 2023-03-30 DIAGNOSIS — H34812 Central retinal vein occlusion, left eye, with macular edema: Secondary | ICD-10-CM | POA: Diagnosis not present

## 2023-05-11 ENCOUNTER — Encounter (INDEPENDENT_AMBULATORY_CARE_PROVIDER_SITE_OTHER): Payer: Medicare HMO | Admitting: Ophthalmology

## 2023-05-11 DIAGNOSIS — Z7984 Long term (current) use of oral hypoglycemic drugs: Secondary | ICD-10-CM | POA: Diagnosis not present

## 2023-05-11 DIAGNOSIS — E113291 Type 2 diabetes mellitus with mild nonproliferative diabetic retinopathy without macular edema, right eye: Secondary | ICD-10-CM

## 2023-05-11 DIAGNOSIS — H34812 Central retinal vein occlusion, left eye, with macular edema: Secondary | ICD-10-CM | POA: Diagnosis not present

## 2023-05-11 DIAGNOSIS — H43813 Vitreous degeneration, bilateral: Secondary | ICD-10-CM

## 2023-06-15 ENCOUNTER — Encounter (INDEPENDENT_AMBULATORY_CARE_PROVIDER_SITE_OTHER): Payer: Medicare HMO | Admitting: Ophthalmology

## 2023-06-15 DIAGNOSIS — E113291 Type 2 diabetes mellitus with mild nonproliferative diabetic retinopathy without macular edema, right eye: Secondary | ICD-10-CM | POA: Diagnosis not present

## 2023-06-15 DIAGNOSIS — H43813 Vitreous degeneration, bilateral: Secondary | ICD-10-CM

## 2023-06-15 DIAGNOSIS — Z7984 Long term (current) use of oral hypoglycemic drugs: Secondary | ICD-10-CM | POA: Diagnosis not present

## 2023-06-15 DIAGNOSIS — H34812 Central retinal vein occlusion, left eye, with macular edema: Secondary | ICD-10-CM

## 2023-07-27 ENCOUNTER — Encounter (INDEPENDENT_AMBULATORY_CARE_PROVIDER_SITE_OTHER): Payer: Medicare HMO | Admitting: Ophthalmology

## 2023-07-27 DIAGNOSIS — Z7984 Long term (current) use of oral hypoglycemic drugs: Secondary | ICD-10-CM | POA: Diagnosis not present

## 2023-07-27 DIAGNOSIS — H43813 Vitreous degeneration, bilateral: Secondary | ICD-10-CM | POA: Diagnosis not present

## 2023-07-27 DIAGNOSIS — H34812 Central retinal vein occlusion, left eye, with macular edema: Secondary | ICD-10-CM

## 2023-07-27 DIAGNOSIS — E113211 Type 2 diabetes mellitus with mild nonproliferative diabetic retinopathy with macular edema, right eye: Secondary | ICD-10-CM

## 2023-08-29 ENCOUNTER — Encounter (INDEPENDENT_AMBULATORY_CARE_PROVIDER_SITE_OTHER): Payer: Medicare HMO | Admitting: Ophthalmology

## 2023-08-29 DIAGNOSIS — Z7984 Long term (current) use of oral hypoglycemic drugs: Secondary | ICD-10-CM

## 2023-08-29 DIAGNOSIS — E113291 Type 2 diabetes mellitus with mild nonproliferative diabetic retinopathy without macular edema, right eye: Secondary | ICD-10-CM | POA: Diagnosis not present

## 2023-08-29 DIAGNOSIS — H43813 Vitreous degeneration, bilateral: Secondary | ICD-10-CM

## 2023-08-29 DIAGNOSIS — H34812 Central retinal vein occlusion, left eye, with macular edema: Secondary | ICD-10-CM | POA: Diagnosis not present

## 2023-10-05 ENCOUNTER — Encounter (INDEPENDENT_AMBULATORY_CARE_PROVIDER_SITE_OTHER): Payer: Medicare HMO | Admitting: Ophthalmology

## 2023-10-05 DIAGNOSIS — E113291 Type 2 diabetes mellitus with mild nonproliferative diabetic retinopathy without macular edema, right eye: Secondary | ICD-10-CM | POA: Diagnosis not present

## 2023-10-05 DIAGNOSIS — Z7984 Long term (current) use of oral hypoglycemic drugs: Secondary | ICD-10-CM | POA: Diagnosis not present

## 2023-10-05 DIAGNOSIS — H43813 Vitreous degeneration, bilateral: Secondary | ICD-10-CM | POA: Diagnosis not present

## 2023-10-05 DIAGNOSIS — H34812 Central retinal vein occlusion, left eye, with macular edema: Secondary | ICD-10-CM | POA: Diagnosis not present

## 2023-11-16 ENCOUNTER — Encounter (INDEPENDENT_AMBULATORY_CARE_PROVIDER_SITE_OTHER): Payer: Medicare HMO | Admitting: Ophthalmology

## 2023-11-16 DIAGNOSIS — E113391 Type 2 diabetes mellitus with moderate nonproliferative diabetic retinopathy without macular edema, right eye: Secondary | ICD-10-CM

## 2023-11-16 DIAGNOSIS — H34812 Central retinal vein occlusion, left eye, with macular edema: Secondary | ICD-10-CM

## 2023-11-16 DIAGNOSIS — Z7984 Long term (current) use of oral hypoglycemic drugs: Secondary | ICD-10-CM

## 2023-11-16 DIAGNOSIS — H43813 Vitreous degeneration, bilateral: Secondary | ICD-10-CM | POA: Diagnosis not present

## 2024-01-03 ENCOUNTER — Encounter (INDEPENDENT_AMBULATORY_CARE_PROVIDER_SITE_OTHER): Payer: PPO | Admitting: Ophthalmology

## 2024-01-03 DIAGNOSIS — E113291 Type 2 diabetes mellitus with mild nonproliferative diabetic retinopathy without macular edema, right eye: Secondary | ICD-10-CM | POA: Diagnosis not present

## 2024-01-03 DIAGNOSIS — Z7984 Long term (current) use of oral hypoglycemic drugs: Secondary | ICD-10-CM

## 2024-01-03 DIAGNOSIS — H34812 Central retinal vein occlusion, left eye, with macular edema: Secondary | ICD-10-CM | POA: Diagnosis not present

## 2024-01-03 DIAGNOSIS — H43813 Vitreous degeneration, bilateral: Secondary | ICD-10-CM

## 2024-02-20 ENCOUNTER — Encounter (INDEPENDENT_AMBULATORY_CARE_PROVIDER_SITE_OTHER): Payer: PPO | Admitting: Ophthalmology

## 2024-02-20 DIAGNOSIS — E113391 Type 2 diabetes mellitus with moderate nonproliferative diabetic retinopathy without macular edema, right eye: Secondary | ICD-10-CM | POA: Diagnosis not present

## 2024-02-20 DIAGNOSIS — Z7984 Long term (current) use of oral hypoglycemic drugs: Secondary | ICD-10-CM

## 2024-02-20 DIAGNOSIS — H34812 Central retinal vein occlusion, left eye, with macular edema: Secondary | ICD-10-CM

## 2024-02-20 DIAGNOSIS — H43813 Vitreous degeneration, bilateral: Secondary | ICD-10-CM

## 2024-03-01 ENCOUNTER — Other Ambulatory Visit: Payer: Self-pay

## 2024-03-01 ENCOUNTER — Inpatient Hospital Stay
Admission: RE | Admit: 2024-03-01 | Discharge: 2024-03-01 | Disposition: A | Discharge status: Home / Self Care | Payer: Self-pay | Source: Ambulatory Visit | Attending: Internal Medicine | Admitting: Internal Medicine

## 2024-03-01 DIAGNOSIS — I350 Nonrheumatic aortic (valve) stenosis: Secondary | ICD-10-CM

## 2024-03-03 NOTE — H&P (View-Only) (Signed)
 Patient ID: James Avery MRN: [id number] DOB/AGE: 1934-04-22 89 y.o.  Primary Care Physician:Whyte, Lorin Picket, MD Primary Cardiologist: None  CC:  Aortic valvular disease management     FOCUSED PROBLEM LIST:   AS AVA 0.82, MG 41, Vmax 3.98, EF 60-65% TTE 2025 T2DM Not on insulin CKD II RBBB BMI 31 March 2024:   The patient is a 89 year old male with the above listed medical problems here for recommendations regarding severe aortic stenosis.  The patient was seen by his PCP recently.  He had noted gradually increasing fatigue over the last few years yet denied chest tightness, presyncope, or syncope.  The patient is here with his wife.  He had a worrisome episode of syncope around [facility].  He was at camp [place].  It was not a hot day.  He was supposed to walk up some stairs and he does not remember what happened but he was found on the ground.  He is evaluated.  He did not suffer head bleed.  He was ultimately discharged home.  He was seen in follow-up by his PCP who ordered an echocardiogram which demonstrated severe aortic stenosis.  For this reason he was referred for further recommendations.  The patient is known he has had a murmur for about 10 or 15 years.  However over the last year or so he has noted increasing fatigue.  He is normally quite an active person especially in the summer.  He fortunately has not had any recurrent presyncope, syncope exertional chest discomfort, or severe shortness of breath.  He denies any paroxysmal nocturnal dyspnea, orthopnea, or signs or symptoms of stroke.  He has not required any emergency room visits or hospitalizations recently.  He indicates very good dental health.         History reviewed. No pertinent past medical history.  History reviewed. No pertinent surgical history.  History reviewed. No pertinent family history.  Social History   Socioeconomic History   Marital status: Married    Spouse name: Not on file    Number of children: Not on file   Years of education: Not on file   Highest education level: Not on file  Occupational History   Not on file  Tobacco Use   Smoking status: Never   Smokeless tobacco: Never  Substance and Sexual Activity   Alcohol use: Not on file   Drug use: Not on file   Sexual activity: Not on file  Other Topics Concern   Not on file  Social History Narrative   Not on file   Social Drivers of Health   Financial Resource Strain: Not on file  Food Insecurity: Low Risk  (12/14/2023)   Received from [facility]   Hunger Vital Sign    Worried About Running Out of Food in the Last Year: Never true    Ran Out of Food in the Last Year: Never true  Transportation Needs: No Transportation Needs (12/14/2023)   Received from [facility]    In the past 12 months, has lack of reliable transportation kept you from medical appointments, meetings, work or from getting things needed for daily living? : No  Physical Activity: Not on file  Stress: Not on file  Social Connections: Not on file  Intimate Partner Violence: Not on file     Prior to Admission medications   Medication Sig Start Date End Date Taking? Authorizing Provider  glimepiride (AMARYL) 1 MG tablet  03/19/17  [provider]  ibuprofen (ADVIL) 800 MG tablet Take 1 tablet (800 mg total) by mouth every 6 (six) hours as needed. 05/10/21   Candelaria Stagers, DPM  metFORMIN (GLUCOPHAGE-XR) 500 MG 24 hr tablet  03/19/17   [provider]  oxyCODONE-acetaminophen (PERCOCET) 5-325 MG tablet Take 1-2 tablets by mouth every 4 (four) hours as needed for severe pain. 05/10/21   Candelaria Stagers, DPM    Allergies  Allergen Reactions   Demerol [Meperidine Hcl]    Pravastatin Sodium    Septra [Sulfamethoxazole-Trimethoprim]     REVIEW OF SYSTEMS:  General: no fevers/chills/night sweats Eyes: no blurry vision, diplopia, or amaurosis ENT: no sore throat or hearing loss Resp: no cough,  wheezing, or hemoptysis CV: no edema or palpitations GI: no abdominal pain, nausea, vomiting, diarrhea, or constipation GU: no dysuria, frequency, or hematuria Skin: no rash Neuro: no headache, numbness, tingling, or weakness of extremities Musculoskeletal: no joint pain or swelling Heme: no bleeding, DVT, or easy bruising Endo: no polydipsia or polyuria  BP 118/72   Pulse 75   Ht 5\' 5"  (1.651 m)   SpO2 96%   BMI 27.79 kg/m   PHYSICAL EXAM: GEN:  AO x 3 in no acute distress HEENT: normal Dentition: Normal Neck: JVP normal. +2 carotid upstrokes without bruits. No thyromegaly. Lungs: equal expansion, clear bilaterally CV: Apex is discrete and nondisplaced, RRR with 3/6 SEM Abd: soft, non-tender, non-distended; no bruit; positive bowel sounds Ext: no edema, ecchymoses, or cyanosis Vascular: 2+ femoral pulses, 2+ radial pulses       Skin: warm and dry without rash Neuro: CN II-XII grossly intact; motor and sensory grossly intact    DATA AND STUDIES:  EKG:  EKG Interpretation Date/Time:  Thursday March 07 2024 07:53:09 EDT Ventricular Rate:  75 PR Interval:  132 QRS Duration:  138 QT Interval:  396 QTC Calculation: 442 R Axis:   -30  Text Interpretation: Normal sinus rhythm Left axis deviation Right bundle branch block No previous ECGs available Confirmed by Alverda Skeans ([id number]) on 03/07/2024 7:57:47 AM            No results found for requested labs within last 365 days.   STS RISK CALCULATOR: Pending  NHYA CLASS: 2   ASSESSMENT AND PLAN:   1. Aortic valve stenosis, etiology of cardiac valve disease unspecified   2. Type 2 diabetes mellitus without complication, without long-term current use of insulin (HCC)   3. CKD (chronic kidney disease) stage 2, GFR 60-89 ml/min   4. RBBB   5. Pre-procedure lab exam     Aortic stenosis: The patient has developed severe symptomatic aortic stenosis with NYHA class II symptoms.  I will refer him for coronary angiography,  right heart catheterization, CTA, and cardiothoracic surgical evaluation.  His wife is to have a pacemaker placed around the beginning of May.  He wants to make sure that his procedure does not interfere with this.  I told him that I thought a definitive aortic valve intervention would not be done until mid or late May at the earliest and that his wife should go ahead and make arrangements to have her pacemaker implanted..  I did tell him that it is quite possible that I personally may not be involved with his procedure and one of my structural heart partners may need to perform his case due to scheduling issues and he is agreeable to this. T2DM:  Start ASA 81mg , would defer statin in this advanced aged patient.  Consider SGLT2i and ARB after aortic stenosis has been managed. CKD II:  Could consider ARB for renal protection after aortic stenosis has been managed. RBBB:  Indicates increased risk for a pacemaker if a TAVR is pursued.  I did review this risk with him.  I have personally reviewed the patients imaging data as summarized above.  I have reviewed the natural history of aortic stenosis with the patient and family members who are present today. We have discussed the limitations of medical therapy and the poor prognosis associated with symptomatic aortic stenosis. We have also reviewed potential treatment options, including palliative medical therapy, conventional surgical aortic valve replacement, and transcatheter aortic valve replacement. We discussed treatment options in the context of this patient's specific comorbid medical conditions.   All of the patient's questions were answered today. Will make further recommendations based on the results of studies outlined above.   I spent 55 minutes reviewing all clinical data during and prior to this visit including all relevant imaging studies, laboratories, clinical information from other health systems and prior notes from both Cardiology and other  specialties, interviewing the patient, conducting a complete physical examination, and coordinating care in order to formulate a comprehensive and personalized evaluation and treatment plan.   Orbie Pyo, MD  03/07/2024 8:46 AM    [facility] [address] [place], [place], [place]  [address] Phone: [phone]; Fax: [phone]

## 2024-03-03 NOTE — Progress Notes (Unsigned)
 Patient ID: James Avery MRN: [id number] DOB/AGE: 1934-04-22 89 y.o.  Primary Care Physician:Whyte, Lorin Picket, MD Primary Cardiologist: None  CC:  Aortic valvular disease management     FOCUSED PROBLEM LIST:   AS AVA 0.82, MG 41, Vmax 3.98, EF 60-65% TTE 2025 T2DM Not on insulin CKD II RBBB BMI 31 March 2024:   The patient is a 89 year old male with the above listed medical problems here for recommendations regarding severe aortic stenosis.  The patient was seen by his PCP recently.  He had noted gradually increasing fatigue over the last few years yet denied chest tightness, presyncope, or syncope.  The patient is here with his wife.  He had a worrisome episode of syncope around [facility].  He was at camp [place].  It was not a hot day.  He was supposed to walk up some stairs and he does not remember what happened but he was found on the ground.  He is evaluated.  He did not suffer head bleed.  He was ultimately discharged home.  He was seen in follow-up by his PCP who ordered an echocardiogram which demonstrated severe aortic stenosis.  For this reason he was referred for further recommendations.  The patient is known he has had a murmur for about 10 or 15 years.  However over the last year or so he has noted increasing fatigue.  He is normally quite an active person especially in the summer.  He fortunately has not had any recurrent presyncope, syncope exertional chest discomfort, or severe shortness of breath.  He denies any paroxysmal nocturnal dyspnea, orthopnea, or signs or symptoms of stroke.  He has not required any emergency room visits or hospitalizations recently.  He indicates very good dental health.         History reviewed. No pertinent past medical history.  History reviewed. No pertinent surgical history.  History reviewed. No pertinent family history.  Social History   Socioeconomic History   Marital status: Married    Spouse name: Not on file    Number of children: Not on file   Years of education: Not on file   Highest education level: Not on file  Occupational History   Not on file  Tobacco Use   Smoking status: Never   Smokeless tobacco: Never  Substance and Sexual Activity   Alcohol use: Not on file   Drug use: Not on file   Sexual activity: Not on file  Other Topics Concern   Not on file  Social History Narrative   Not on file   Social Drivers of Health   Financial Resource Strain: Not on file  Food Insecurity: Low Risk  (12/14/2023)   Received from [facility]   Hunger Vital Sign    Worried About Running Out of Food in the Last Year: Never true    Ran Out of Food in the Last Year: Never true  Transportation Needs: No Transportation Needs (12/14/2023)   Received from [facility]    In the past 12 months, has lack of reliable transportation kept you from medical appointments, meetings, work or from getting things needed for daily living? : No  Physical Activity: Not on file  Stress: Not on file  Social Connections: Not on file  Intimate Partner Violence: Not on file     Prior to Admission medications   Medication Sig Start Date End Date Taking? Authorizing Provider  glimepiride (AMARYL) 1 MG tablet  03/19/17  [provider]  ibuprofen (ADVIL) 800 MG tablet Take 1 tablet (800 mg total) by mouth every 6 (six) hours as needed. 05/10/21   Candelaria Stagers, DPM  metFORMIN (GLUCOPHAGE-XR) 500 MG 24 hr tablet  03/19/17   [provider]  oxyCODONE-acetaminophen (PERCOCET) 5-325 MG tablet Take 1-2 tablets by mouth every 4 (four) hours as needed for severe pain. 05/10/21   Candelaria Stagers, DPM    Allergies  Allergen Reactions   Demerol [Meperidine Hcl]    Pravastatin Sodium    Septra [Sulfamethoxazole-Trimethoprim]     REVIEW OF SYSTEMS:  General: no fevers/chills/night sweats Eyes: no blurry vision, diplopia, or amaurosis ENT: no sore throat or hearing loss Resp: no cough,  wheezing, or hemoptysis CV: no edema or palpitations GI: no abdominal pain, nausea, vomiting, diarrhea, or constipation GU: no dysuria, frequency, or hematuria Skin: no rash Neuro: no headache, numbness, tingling, or weakness of extremities Musculoskeletal: no joint pain or swelling Heme: no bleeding, DVT, or easy bruising Endo: no polydipsia or polyuria  BP 118/72   Pulse 75   Ht 5\' 5"  (1.651 m)   SpO2 96%   BMI 27.79 kg/m   PHYSICAL EXAM: GEN:  AO x 3 in no acute distress HEENT: normal Dentition: Normal Neck: JVP normal. +2 carotid upstrokes without bruits. No thyromegaly. Lungs: equal expansion, clear bilaterally CV: Apex is discrete and nondisplaced, RRR with 3/6 SEM Abd: soft, non-tender, non-distended; no bruit; positive bowel sounds Ext: no edema, ecchymoses, or cyanosis Vascular: 2+ femoral pulses, 2+ radial pulses       Skin: warm and dry without rash Neuro: CN II-XII grossly intact; motor and sensory grossly intact    DATA AND STUDIES:  EKG:  EKG Interpretation Date/Time:  Thursday March 07 2024 07:53:09 EDT Ventricular Rate:  75 PR Interval:  132 QRS Duration:  138 QT Interval:  396 QTC Calculation: 442 R Axis:   -30  Text Interpretation: Normal sinus rhythm Left axis deviation Right bundle branch block No previous ECGs available Confirmed by Alverda Skeans ([id number]) on 03/07/2024 7:57:47 AM            No results found for requested labs within last 365 days.   STS RISK CALCULATOR: Pending  NHYA CLASS: 2   ASSESSMENT AND PLAN:   1. Aortic valve stenosis, etiology of cardiac valve disease unspecified   2. Type 2 diabetes mellitus without complication, without long-term current use of insulin (HCC)   3. CKD (chronic kidney disease) stage 2, GFR 60-89 ml/min   4. RBBB   5. Pre-procedure lab exam     Aortic stenosis: The patient has developed severe symptomatic aortic stenosis with NYHA class II symptoms.  I will refer him for coronary angiography,  right heart catheterization, CTA, and cardiothoracic surgical evaluation.  His wife is to have a pacemaker placed around the beginning of May.  He wants to make sure that his procedure does not interfere with this.  I told him that I thought a definitive aortic valve intervention would not be done until mid or late May at the earliest and that his wife should go ahead and make arrangements to have her pacemaker implanted..  I did tell him that it is quite possible that I personally may not be involved with his procedure and one of my structural heart partners may need to perform his case due to scheduling issues and he is agreeable to this. T2DM:  Start ASA 81mg , would defer statin in this advanced aged patient.  Consider SGLT2i and ARB after aortic stenosis has been managed. CKD II:  Could consider ARB for renal protection after aortic stenosis has been managed. RBBB:  Indicates increased risk for a pacemaker if a TAVR is pursued.  I did review this risk with him.  I have personally reviewed the patients imaging data as summarized above.  I have reviewed the natural history of aortic stenosis with the patient and family members who are present today. We have discussed the limitations of medical therapy and the poor prognosis associated with symptomatic aortic stenosis. We have also reviewed potential treatment options, including palliative medical therapy, conventional surgical aortic valve replacement, and transcatheter aortic valve replacement. We discussed treatment options in the context of this patient's specific comorbid medical conditions.   All of the patient's questions were answered today. Will make further recommendations based on the results of studies outlined above.   I spent 55 minutes reviewing all clinical data during and prior to this visit including all relevant imaging studies, laboratories, clinical information from other health systems and prior notes from both Cardiology and other  specialties, interviewing the patient, conducting a complete physical examination, and coordinating care in order to formulate a comprehensive and personalized evaluation and treatment plan.   Orbie Pyo, MD  03/07/2024 8:46 AM    [facility] [address] [place], [place], [place]  [address] Phone: [phone]; Fax: [phone]

## 2024-03-07 ENCOUNTER — Encounter: Payer: Self-pay | Admitting: Internal Medicine

## 2024-03-07 ENCOUNTER — Ambulatory Visit: Payer: Self-pay | Attending: Internal Medicine | Admitting: Internal Medicine

## 2024-03-07 VITALS — BP 118/72 | HR 75 | Ht 65.0 in | Wt 159.8 lb

## 2024-03-07 DIAGNOSIS — I4510 Unspecified right bundle-branch block: Secondary | ICD-10-CM

## 2024-03-07 DIAGNOSIS — I350 Nonrheumatic aortic (valve) stenosis: Secondary | ICD-10-CM

## 2024-03-07 DIAGNOSIS — N182 Chronic kidney disease, stage 2 (mild): Secondary | ICD-10-CM

## 2024-03-07 DIAGNOSIS — Z01812 Encounter for preprocedural laboratory examination: Secondary | ICD-10-CM

## 2024-03-07 DIAGNOSIS — E119 Type 2 diabetes mellitus without complications: Secondary | ICD-10-CM

## 2024-03-07 MED ORDER — ASPIRIN 81 MG PO TBEC: 81.0000 mg | DELAYED_RELEASE_TABLET | Freq: Every day | ORAL | Status: AC

## 2024-03-07 NOTE — Patient Instructions (Addendum)
 Medication Instructions:  No changes *If you need a refill on your cardiac medications before your next appointment, please call your pharmacy*  Lab Work: Today: cbc, bmet at [facility] first floor   Testing/Procedures: Your physician has requested that you have a cardiac catheterization. Cardiac catheterization is used to diagnose and/or treat various heart conditions. Doctors may recommend this procedure for a number of different reasons. The most common reason is to evaluate chest pain. Chest pain can be a symptom of coronary artery disease (CAD), and cardiac catheterization can show whether plaque is narrowing or blocking your heart's arteries. This procedure is also used to evaluate the valves, as well as measure the blood flow and oxygen levels in different parts of your heart. For further information please visit [email or web address] Please follow instruction sheet, as given.   Follow-Up: Per Structural Heart Team        Cardiac/Peripheral Catheterization   You are scheduled for a Cardiac Catheterization on Thursday, April 10 with Dr. Alverda Skeans.  1. Please arrive at the [facility] (Main Entrance A) at [facility]: [address] [place], [place] [address] at 9:00 AM (This time is TWO hour(s) before your procedure to ensure your preparation).   Free valet parking service is available. You will check in at ADMITTING. The support person will be asked to wait in the waiting room.  It is OK to have someone drop you off and come back when you are ready to be discharged.        Special note: Every effort is made to have your procedure done on time. Please understand that emergencies sometimes delay scheduled procedures.  2. Diet: Do not eat solid foods after midnight.  You may have clear liquids until 5 AM the day of the procedure.  3. Labs: You will need to have blood drawn today.  You do not need to be fasting.  4. Medication instructions in preparation for your  procedure:   Contrast Allergy: No  No metformin on day of procedure, or 48 hours afterward.  (None on Thursday, Friday or Saturday April 10, 11, 12) Restart Sunday April 13.   No Amaryl day of procedure.  On the morning of your procedure, take Aspirin 81 mg and any morning medicines NOT listed above.  You may use sips of water.  5. Plan to go home the same day, you will only stay overnight if medically necessary. 6. You MUST have a responsible adult to drive you home. 7. An adult MUST be with you the first 24 hours after you arrive home. 8. Bring a current list of your medications, and the last time and date medication taken. 9. Bring ID and current insurance cards. 10.Please wear clothes that are easy to get on and off and wear slip-on shoes.  Thank you for allowing [place] to care for you!   -- [facility]     1st Floor: - Lobby - Registration  - Pharmacy  - Lab - Cafe  2nd Floor: - PV Lab - Diagnostic Testing (echo, CT, nuclear med)  3rd Floor: - Vacant  4th Floor: - TCTS (cardiothoracic surgery) - AFib Clinic - Structural Heart Clinic - Vascular Surgery  - Vascular Ultrasound  5th Floor: - HeartCare Cardiology (general and EP) - Clinical Pharmacy for coumadin, hypertension, lipid, weight-loss medications, and med management appointments    Valet parking services will be available as well.

## 2024-03-07 NOTE — Addendum Note (Signed)
 Addended by: Lendon Ka on: 03/07/2024 09:24 AM   Modules accepted: Orders

## 2024-03-07 NOTE — Progress Notes (Addendum)
 Pre Surgical Assessment: 5 M Walk Test  22M=16.93ft  5 Meter Walk Test- trial 1: 4.65 seconds 5 Meter Walk Test- trial 2: 4.63 seconds 5 Meter Walk Test- trial 3: 4.61 seconds 5 Meter Walk Test Average: 4.63 seconds  _____________________________  Procedure Type: Isolated AVR Perioperative Outcome Estimate % Operative Mortality 3.36% Morbidity & Mortality 8.68% Stroke 1.4% Renal Failure 1.01% Reoperation 3.35% Prolonged Ventilation 3.26% Deep Sternal Wound Infection 0.042% Long Hospital Stay (>14 days) 4.49% Short Hospital Stay (<6 days)* 41.6%

## 2024-03-08 LAB — BASIC METABOLIC PANEL WITH GFR
BUN/Creatinine Ratio: 14 (ref 10–24)
BUN: 13 mg/dL (ref 8–27)
CO2: 25 mmol/L (ref 20–29)
Calcium: 9.5 mg/dL (ref 8.6–10.2)
Chloride: 98 mmol/L (ref 96–106)
Creatinine, Ser: 0.94 mg/dL (ref 0.76–1.27)
Glucose: 107 mg/dL — ABNORMAL HIGH (ref 70–99)
Potassium: 4.8 mmol/L (ref 3.5–5.2)
Sodium: 138 mmol/L (ref 134–144)
eGFR: 78 mL/min/{1.73_m2} (ref >59)

## 2024-03-08 LAB — CBC
Hematocrit: 39.4 % (ref 37.5–51.0)
Hemoglobin: 13 g/dL (ref 13.0–17.7)
MCH: 29.8 pg (ref 26.6–33.0)
MCHC: 33 g/dL (ref 31.5–35.7)
MCV: 90 fL (ref 79–97)
Platelets: 335 10*3/uL (ref 150–450)
RBC: 4.36 x10E6/uL (ref 4.14–5.80)
RDW: 14 % (ref 11.6–15.4)
WBC: 7.4 10*3/uL (ref 3.4–10.8)

## 2024-03-12 ENCOUNTER — Telehealth: Payer: Self-pay | Admitting: *Deleted

## 2024-03-12 NOTE — Telephone Encounter (Signed)
 Cardiac Catheterization scheduled at [facility] for: Thursday March 14, 2024 11 AM Arrival time [facility] Main Entrance A at: 9 AM  Nothing to eat after midnight prior to procedure, clear liquids until 5 AM day of procedure.  Medication instructions: -Hold:  Metformin-day of procedure and 48 hours post procedure -Other usual morning medications can be taken with sips of water including aspirin 81 mg.  Plan to go home the same day, you will only stay overnight if medically necessary.  You must have responsible adult to drive you home.  Someone must be with you the first 24 hours after you arrive home.  Reviewed procedure instructions with patient.

## 2024-03-14 ENCOUNTER — Encounter (HOSPITAL_COMMUNITY)
Admission: RE | Disposition: A | Discharge status: Home / Self Care | Payer: Self-pay | Source: Home / Self Care | Attending: Internal Medicine

## 2024-03-14 ENCOUNTER — Ambulatory Visit (HOSPITAL_COMMUNITY)
Admission: RE | Admit: 2024-03-14 | Discharge: 2024-03-14 | Disposition: A | Discharge status: Home / Self Care | Attending: Internal Medicine | Admitting: Internal Medicine

## 2024-03-14 ENCOUNTER — Other Ambulatory Visit: Payer: Self-pay

## 2024-03-14 DIAGNOSIS — E1122 Type 2 diabetes mellitus with diabetic chronic kidney disease: Secondary | ICD-10-CM | POA: Insufficient documentation

## 2024-03-14 DIAGNOSIS — I2510 Atherosclerotic heart disease of native coronary artery without angina pectoris: Secondary | ICD-10-CM | POA: Diagnosis not present

## 2024-03-14 DIAGNOSIS — I4510 Unspecified right bundle-branch block: Secondary | ICD-10-CM | POA: Diagnosis not present

## 2024-03-14 DIAGNOSIS — Z79899 Other long term (current) drug therapy: Secondary | ICD-10-CM | POA: Diagnosis not present

## 2024-03-14 DIAGNOSIS — I350 Nonrheumatic aortic (valve) stenosis: Secondary | ICD-10-CM | POA: Insufficient documentation

## 2024-03-14 DIAGNOSIS — Z7982 Long term (current) use of aspirin: Secondary | ICD-10-CM | POA: Diagnosis not present

## 2024-03-14 DIAGNOSIS — N182 Chronic kidney disease, stage 2 (mild): Secondary | ICD-10-CM | POA: Insufficient documentation

## 2024-03-14 DIAGNOSIS — Z7984 Long term (current) use of oral hypoglycemic drugs: Secondary | ICD-10-CM | POA: Insufficient documentation

## 2024-03-14 HISTORY — PX: RIGHT HEART CATH AND CORONARY ANGIOGRAPHY: CATH118264

## 2024-03-14 HISTORY — PX: PELVIC ANGIOGRAPHY: CATH118254

## 2024-03-14 LAB — POCT I-STAT 7, (LYTES, BLD GAS, ICA,H+H)
Acid-Base Excess: 0 mmol/L (ref 0.0–2.0)
Bicarbonate: 25.8 mmol/L (ref 20.0–28.0)
Calcium, Ion: 1.21 mmol/L (ref 1.15–1.40)
HCT: 36 % — ABNORMAL LOW (ref 39.0–52.0)
Hemoglobin: 12.2 g/dL — ABNORMAL LOW (ref 13.0–17.0)
O2 Saturation: 96 %
Potassium: 4.1 mmol/L (ref 3.5–5.1)
Sodium: 138 mmol/L (ref 135–145)
TCO2: 27 mmol/L (ref 22–32)
pCO2 arterial: 43.5 mmHg (ref 32–48)
pH, Arterial: 7.381 (ref 7.35–7.45)
pO2, Arterial: 85 mmHg (ref 83–108)

## 2024-03-14 LAB — POCT I-STAT EG7
Acid-Base Excess: 0 mmol/L (ref 0.0–2.0)
Acid-Base Excess: 0 mmol/L (ref 0.0–2.0)
Bicarbonate: 26.1 mmol/L (ref 20.0–28.0)
Bicarbonate: 26.5 mmol/L (ref 20.0–28.0)
Calcium, Ion: 1.16 mmol/L (ref 1.15–1.40)
Calcium, Ion: 1.2 mmol/L (ref 1.15–1.40)
HCT: 35 % — ABNORMAL LOW (ref 39.0–52.0)
HCT: 36 % — ABNORMAL LOW (ref 39.0–52.0)
Hemoglobin: 11.9 g/dL — ABNORMAL LOW (ref 13.0–17.0)
Hemoglobin: 12.2 g/dL — ABNORMAL LOW (ref 13.0–17.0)
O2 Saturation: 71 %
O2 Saturation: 77 %
Potassium: 3.9 mmol/L (ref 3.5–5.1)
Potassium: 4 mmol/L (ref 3.5–5.1)
Sodium: 137 mmol/L (ref 135–145)
Sodium: 138 mmol/L (ref 135–145)
TCO2: 28 mmol/L (ref 22–32)
TCO2: 28 mmol/L (ref 22–32)
pCO2, Ven: 48.9 mmHg (ref 44–60)
pCO2, Ven: 49.6 mmHg (ref 44–60)
pH, Ven: 7.329 (ref 7.25–7.43)
pH, Ven: 7.341 (ref 7.25–7.43)
pO2, Ven: 40 mmHg (ref 32–45)
pO2, Ven: 45 mmHg (ref 32–45)

## 2024-03-14 SURGERY — RIGHT HEART CATH AND CORONARY ANGIOGRAPHY
Anesthesia: LOCAL

## 2024-03-14 MED ORDER — VERAPAMIL HCL 2.5 MG/ML IV SOLN
INTRAVENOUS | Status: DC | PRN
  Administered 2024-03-14: 10 mL via INTRA_ARTERIAL

## 2024-03-14 MED ORDER — FENTANYL CITRATE (PF) 100 MCG/2ML IJ SOLN
INTRAMUSCULAR | Status: DC | PRN
  Administered 2024-03-14: 25 ug via INTRAVENOUS

## 2024-03-14 MED ORDER — LABETALOL HCL 5 MG/ML IV SOLN: 10.0000 mg | INTRAVENOUS | Status: DC | PRN

## 2024-03-14 MED ORDER — ASPIRIN 81 MG PO CHEW: 81.0000 mg | CHEWABLE_TABLET | ORAL | Status: DC

## 2024-03-14 MED ORDER — HEPARIN SODIUM (PORCINE) 1000 UNIT/ML IJ SOLN
INTRAMUSCULAR | Status: AC
  Filled 2024-03-14: qty 10

## 2024-03-14 MED ORDER — IOHEXOL 350 MG/ML SOLN
INTRAVENOUS | Status: DC | PRN
  Administered 2024-03-14: 60 mL

## 2024-03-14 MED ORDER — SODIUM CHLORIDE 0.9 % WEIGHT BASED INFUSION: 3.0000 mL/kg/h | INTRAVENOUS | Status: DC

## 2024-03-14 MED ORDER — LIDOCAINE HCL (PF) 1 % IJ SOLN
INTRAMUSCULAR | Status: AC
  Filled 2024-03-14: qty 30

## 2024-03-14 MED ORDER — MIDAZOLAM HCL 2 MG/2ML IJ SOLN
INTRAMUSCULAR | Status: DC | PRN
  Administered 2024-03-14: 1 mg via INTRAVENOUS

## 2024-03-14 MED ORDER — FENTANYL CITRATE (PF) 100 MCG/2ML IJ SOLN
INTRAMUSCULAR | Status: AC
  Filled 2024-03-14: qty 2

## 2024-03-14 MED ORDER — HEPARIN (PORCINE) IN NACL 1000-0.9 UT/500ML-% IV SOLN
INTRAVENOUS | Status: DC | PRN
  Administered 2024-03-14: 1000 mL

## 2024-03-14 MED ORDER — LIDOCAINE HCL (PF) 1 % IJ SOLN
INTRAMUSCULAR | Status: DC | PRN
  Administered 2024-03-14: 5 mL

## 2024-03-14 MED ORDER — HYDRALAZINE HCL 20 MG/ML IJ SOLN: 10.0000 mg | INTRAMUSCULAR | Status: DC | PRN

## 2024-03-14 MED ORDER — MIDAZOLAM HCL 2 MG/2ML IJ SOLN
INTRAMUSCULAR | Status: AC
  Filled 2024-03-14: qty 2

## 2024-03-14 MED ORDER — ONDANSETRON HCL 4 MG/2ML IJ SOLN
4.0000 mg | Freq: Four times a day (QID) | INTRAMUSCULAR | Status: DC | PRN
Start: 2024-03-14 — End: 2024-03-14

## 2024-03-14 MED ORDER — HEPARIN SODIUM (PORCINE) 1000 UNIT/ML IJ SOLN
INTRAMUSCULAR | Status: DC | PRN
  Administered 2024-03-14: 5000 [IU] via INTRA_ARTERIAL

## 2024-03-14 MED ORDER — SODIUM CHLORIDE 0.9 % IV SOLN
INTRAVENOUS | Status: DC | PRN
  Administered 2024-03-14: 250 mL via INTRAVENOUS

## 2024-03-14 MED ORDER — VERAPAMIL HCL 2.5 MG/ML IV SOLN
INTRAVENOUS | Status: AC
  Filled 2024-03-14: qty 2

## 2024-03-14 MED ORDER — ACETAMINOPHEN 325 MG PO TABS
650.0000 mg | ORAL_TABLET | ORAL | Status: DC | PRN
Start: 2024-03-14 — End: 2024-03-14

## 2024-03-14 MED ORDER — SODIUM CHLORIDE 0.9 % WEIGHT BASED INFUSION: 1.0000 mL/kg/h | INTRAVENOUS | Status: DC

## 2024-03-14 SURGICAL SUPPLY — 13 items
CATH BALLN WEDGE 5F 110CM (CATHETERS) IMPLANT
CATH INFINITI 5 FR JL3.5 (CATHETERS) IMPLANT
CATH INFINITI 5FR ANG PIGTAIL (CATHETERS) IMPLANT
CATH INFINITI 5FR JL4 (CATHETERS) IMPLANT
CATH INFINITI AMBI 6FR TG (CATHETERS) IMPLANT
CATH VISTA GUIDE 6FR XB3.5 EPK (CATHETERS) IMPLANT
DEVICE RAD COMP TR BAND LRG (VASCULAR PRODUCTS) IMPLANT
GLIDESHEATH SLEND SS 6F .021 (SHEATH) IMPLANT
KIT ESSENTIALS PG (KITS) IMPLANT
PACK CARDIAC CATHETERIZATION (CUSTOM PROCEDURE TRAY) ×3 IMPLANT
SET ATX-X65L (MISCELLANEOUS) IMPLANT
SHEATH GLIDE SLENDER 4/5FR (SHEATH) IMPLANT
WIRE EMERALD 3MM-J .035X260CM (WIRE) IMPLANT

## 2024-03-14 NOTE — Interval H&P Note (Signed)
 History and Physical Interval Note:  03/14/2024 9:45 AM  James Avery  has presented today for surgery, with the diagnosis of aortic stenosis.  The various methods of treatment have been discussed with the patient and family. After consideration of risks, benefits and other options for treatment, the patient has consented to  Procedure(s): RIGHT/LEFT HEART CATH AND CORONARY ANGIOGRAPHY (N/A) as a surgical intervention.  The patient's history has been reviewed, patient examined, no change in status, stable for surgery.  I have reviewed the patient's chart and labs.  Questions were answered to the patient's satisfaction.     Orbie Pyo

## 2024-03-14 NOTE — Discharge Instructions (Addendum)
 Restart metformin on 03/16/24. Radial Site Care Drink plenty of fluid for the next 3 days  Keep arm elevated for the next 24 hours The following information offers guidance on how to care for yourself after your procedure. Your health care provider may also give you more specific instructions. If you have problems or questions, contact your health care provider. What can I expect after the procedure? After the procedure, it is common to have bruising and tenderness in the incision area. Follow these instructions at home: Incision site care  Follow instructions from your health care provider about how to take care of your incision site. Make sure you:  Remove your dressing 24 hours after discharge.  Do not take baths, swim, or use a hot tub for 1 week. You may shower 24 hours after the procedure or as told by your health care provider. Remove the dressing and gently wash the incision area with plain soap and water. Pat the area dry with a clean towel. Do not rub the site. That could cause bleeding. Do not apply powder or lotion to the site. Check your incision site every day for signs of infection. Check for: Redness, swelling, or pain. Fluid or blood. Warmth. Pus or a bad smell. Activity For 24 hours after the procedure, or as directed by your health care provider: Do not flex or bend the affected arm. Do not push or pull heavy objects with the affected arm. Do not operate machinery or power tools. Do not drive. You should not drive yourself home from the hospital or clinic if you go home during that time period. You may drive 24 hours after the procedure unless your health care provider tells you not to. Do not lift anything that is heavier than 10 lb (4.5 kg), or the limit that you are told, until your health care provider says that it is safe. Return to your normal activities as told by your health care provider. Ask your health care provider what activities are safe for you and  when you can return to work. If you were given a sedative during the procedure, it can affect you for several hours. Do not drive or operate machinery until your health care provider says that it is safe. General instructions Take over-the-counter and prescription medicines only as told by your health care provider. If you will be going home right after the procedure, plan to have a responsible adult care for you for the time you are told. This is important. Keep all follow-up visits. This is important. Contact a health care provider if: You have a fever or chills. You have any of these signs of infection at your incision site: Redness, swelling, or pain. Fluid or blood. Warmth. Pus or a bad smell. Get help right away if: The incision area swells very fast. The incision area is bleeding, and the bleeding does not stop when you hold steady pressure on the area. Your arm or hand becomes pale, cool, tingly, or numb. These symptoms may represent a serious problem that is an emergency. Do not wait to see if the symptoms will go away. Get medical help right away. Call your local emergency services (911 in the [place] Do not drive yourself to the hospital. Summary After the procedure, it is common to have bruising and tenderness at the incision site. Follow instructions from your health care provider about how to take care of your radial site incision. Check the incision every day for signs of infection. Do not  lift anything that is heavier than 10 lb (4.5 kg), or the limit that you are told, until your health care provider says that it is safe. Get help right away if the incision area swells very fast, you have bleeding at the incision site that will not stop, or your arm or hand becomes pale, cool, or numb. This information is not intended to replace advice given to you by your health care provider. Make sure you discuss any questions you have with your health care provider. Document Revised:  01/10/2021 Document Reviewed: 01/10/2021 Elsevier Patient Education  2023 [employer].

## 2024-03-14 NOTE — Progress Notes (Signed)
TR BAND REMOVAL  LOCATION:    right radial  DEFLATED PER PROTOCOL:    Yes.    TIME BAND OFF / DRESSING APPLIED:    1415 gauze dressing applied   SITE UPON ARRIVAL:    Level 0  SITE AFTER BAND REMOVAL:    Level 0  CIRCULATION SENSATION AND MOVEMENT:    Within Normal Limits   Yes.    COMMENTS:   no issues noted  

## 2024-03-15 ENCOUNTER — Encounter (HOSPITAL_COMMUNITY): Payer: Self-pay | Admitting: Internal Medicine

## 2024-03-25 ENCOUNTER — Other Ambulatory Visit: Payer: Self-pay

## 2024-03-25 DIAGNOSIS — I350 Nonrheumatic aortic (valve) stenosis: Secondary | ICD-10-CM

## 2024-04-02 ENCOUNTER — Encounter (HOSPITAL_COMMUNITY): Payer: Self-pay

## 2024-04-02 ENCOUNTER — Ambulatory Visit (HOSPITAL_COMMUNITY)
Admission: RE | Admit: 2024-04-02 | Discharge: 2024-04-02 | Disposition: A | Discharge status: Home / Self Care | Source: Ambulatory Visit | Attending: Internal Medicine | Admitting: Internal Medicine

## 2024-04-02 DIAGNOSIS — I350 Nonrheumatic aortic (valve) stenosis: Secondary | ICD-10-CM | POA: Diagnosis present

## 2024-04-02 DIAGNOSIS — I2510 Atherosclerotic heart disease of native coronary artery without angina pectoris: Secondary | ICD-10-CM | POA: Diagnosis not present

## 2024-04-02 MED ORDER — IOHEXOL 350 MG/ML SOLN
100.0000 mL | Freq: Once | INTRAVENOUS | Status: AC | PRN
  Administered 2024-04-02: 100 mL via INTRAVENOUS

## 2024-04-11 ENCOUNTER — Encounter (INDEPENDENT_AMBULATORY_CARE_PROVIDER_SITE_OTHER): Admitting: Ophthalmology

## 2024-04-11 DIAGNOSIS — H34812 Central retinal vein occlusion, left eye, with macular edema: Secondary | ICD-10-CM

## 2024-04-11 DIAGNOSIS — H43812 Vitreous degeneration, left eye: Secondary | ICD-10-CM | POA: Diagnosis not present

## 2024-04-16 ENCOUNTER — Ambulatory Visit: Payer: Self-pay | Admitting: Internal Medicine

## 2024-05-08 ENCOUNTER — Ambulatory Visit: Attending: Surgery | Admitting: Surgery

## 2024-05-08 ENCOUNTER — Encounter: Payer: Self-pay | Admitting: Surgery

## 2024-05-08 VITALS — BP 138/78 | HR 81 | Resp 18 | Ht 64.0 in | Wt 151.0 lb

## 2024-05-08 DIAGNOSIS — I350 Nonrheumatic aortic (valve) stenosis: Secondary | ICD-10-CM | POA: Diagnosis not present

## 2024-05-08 NOTE — Progress Notes (Signed)
 Patient ID: James Avery, male   DOB: 1934-02-15, 89 y.o.   MRN: [id number]  [facility]       [address]       [place] [id number]             [phone]          CARDIOTHORACIC SURGERY CONSULTATION REPORT  PCP is Karena Ota, MD Referring Provider is Alyssa Backbone, MD Primary Cardiologist is None  Reason for consultation:  Severe aortic stenosis  HPI:  The patient is an 89 year old gentleman with history of type 2 diabetes, stage II chronic kidney disease, and aortic stenosis who was referred for consideration of aortic valve replacement.  He presented with a history of gradually increasing fatigue over the past couple years and then had an episode of syncope over [facility] weekend.  He was walking up some stairs and blacked out falling down the stairs.  He does not remember getting dizzy.  Luckily he did not suffer any major injury.  He subsequently had a 2D echocardiogram that showed a mean gradient of 41 mmHg with a valve area of 0.82 cm.  Left ventricular ejection fraction was 60 to 65%.  He is here today with his wife.  He has noted gradual onset of exertional fatigue and some shortness of breath.  He has had no chest pain.  He had no history of prior dizziness or syncope.  He denies orthopnea and peripheral edema.  Past Surgical History:  Procedure Laterality Date   PELVIC ANGIOGRAPHY  03/14/2024   Procedure: PELVIC ANGIOGRAPHY;  Surgeon: Kyra Phy, MD;  Location: [facility];  Service: Cardiovascular;;   RIGHT HEART CATH AND CORONARY ANGIOGRAPHY N/A 03/14/2024   Procedure: RIGHT HEART CATH AND CORONARY ANGIOGRAPHY;  Surgeon: Kyra Phy, MD;  Location: [facility];  Service: Cardiovascular;  Laterality: N/A;    No family history on file.  Social History   Socioeconomic History   Marital status: Married    Spouse name: Not on file   Number of children: Not on file    Years of education: Not on file   Highest education level: Not on file  Occupational History   Not on file  Tobacco Use   Smoking status: Never   Smokeless tobacco: Never  Substance and Sexual Activity   Alcohol use: Not on file   Drug use: Not on file   Sexual activity: Not on file  Other Topics Concern   Not on file  Social History Narrative   Not on file   Social Drivers of Health   Financial Resource Strain: Not on file  Food Insecurity: Low Risk  (12/14/2023)   Received from [facility]   Hunger Vital Sign    Worried About Running Out of Food in the Last Year: Never true    Ran Out of Food in the Last Year: Never true  Transportation Needs: No Transportation Needs (12/14/2023)   Received from [facility]    In the past 12 months, has lack of reliable transportation kept you from medical appointments, meetings, work or from getting things needed for daily living? : No  Physical Activity: Not on file  Stress: Not on file  Social Connections: Not on file  Intimate Partner Violence: Not on file    Prior to Admission medications   Medication Sig Start Date End Date Taking? Authorizing Provider  aspirin   EC 81 MG tablet Take 1 tablet (81 mg total) by mouth daily. Swallow whole. 03/07/24  Yes Thukkani, Arun K, MD  cyanocobalamin (VITAMIN B12) 1000 MCG tablet Take 1,000 mcg by mouth daily.   Yes [provider]  ezetimibe (ZETIA) 10 MG tablet Take 10 mg by mouth daily.   Yes [provider]  metFORMIN (GLUCOPHAGE-XR) 500 MG 24 hr tablet Take 1,000 mg by mouth 2 (two) times daily with a meal. 03/19/17  Yes [provider]  Multiple Vitamins-Minerals (MULTIVITAMIN WITH MINERALS) tablet Take 1 tablet by mouth daily.   Yes [provider]  trimethoprim-polymyxin b (POLYTRIM) ophthalmic solution Place 1 drop into the left eye See admin instructions. Receives eye injection about every 5-6 weeks, uses eye drops after procedure for that  day   Yes [provider]    Current Outpatient Medications  Medication Sig Dispense Refill   aspirin  EC 81 MG tablet Take 1 tablet (81 mg total) by mouth daily. Swallow whole.     cyanocobalamin (VITAMIN B12) 1000 MCG tablet Take 1,000 mcg by mouth daily.     ezetimibe (ZETIA) 10 MG tablet Take 10 mg by mouth daily.     metFORMIN (GLUCOPHAGE-XR) 500 MG 24 hr tablet Take 1,000 mg by mouth 2 (two) times daily with a meal.     Multiple Vitamins-Minerals (MULTIVITAMIN WITH MINERALS) tablet Take 1 tablet by mouth daily.     trimethoprim-polymyxin b (POLYTRIM) ophthalmic solution Place 1 drop into the left eye See admin instructions. Receives eye injection about every 5-6 weeks, uses eye drops after procedure for that day     No current facility-administered medications for this visit.    Allergies  Allergen Reactions   Demerol [Meperidine Hcl]     Does not remember reaction    Fluorescein  Nausea And Vomiting    Retinal dye injection    Pravastatin Sodium     Does not remember reaction    Septra [Sulfamethoxazole-Trimethoprim]     Does not remember reaction       Review of Systems:   General:  normal appetite, + decreased energy, no weight gain, no weight loss, no fever  Cardiac:  no chest pain with exertion, no chest pain at rest, +SOB with moderate exertion, no resting SOB, no PND, no orthopnea, no palpitations, no arrhythmia, no atrial fibrillation, no LE edema, no dizzy spells, + single episode of syncope  Respiratory:  + exertional shortness of breath, no home oxygen, no productive cough, no dry cough, no bronchitis, no wheezing, no hemoptysis, no asthma, no pain with inspiration or cough, no sleep apnea, no CPAP at night  GI:   no difficulty swallowing, no reflux, no frequent heartburn, no hiatal hernia, no abdominal pain, no constipation, no diarrhea, no hematochezia, no hematemesis, no melena  GU:   no dysuria,  no frequency, no urinary tract infection, no hematuria, no  enlarged prostate, no kidney stones, + chronic kidney disease  Vascular:  no pain suggestive of claudication, no pain in feet, no leg cramps, no varicose veins, no DVT, no non-healing foot ulcer  Neuro:   no stroke, no TIA's, no seizures, no headaches, no temporary blindness one eye,  no slurred speech, no peripheral neuropathy, no chronic pain, no instability of gait, no memory/cognitive dysfunction  Musculoskeletal: no arthritis, no joint swelling, no myalgias, no difficulty walking, normal mobility   Skin:   no rash, no itching, no skin infections, no pressure sores or ulcerations  Psych:   no anxiety,  no depression, no nervousness, no unusual recent stress  Eyes:   no blurry vision, no floaters, no recent vision changes, + wears glasses or contacts  ENT:   no hearing loss, no loose or painful teeth, no dentures, last saw dentist this year  Hematologic:  no easy bruising, no abnormal bleeding, no clotting disorder, no frequent epistaxis  Endocrine:  + diabetes, does check CBG's at home     Physical Exam:   BP 138/78   Pulse 81   Resp 18   Ht 5\' 4"  (1.626 m)   Wt 151 lb (68.5 kg)   SpO2 98%   BMI 25.92 kg/m   General:  Elderly,  well-appearing  HEENT:  Unremarkable, NCAT, PERLA, EOMI  Neck:   no JVD, no bruits, no adenopathy   Chest:   clear to auscultation, symmetrical breath sounds, no wheezes, no rhonchi   CV:   RRR, 3/6 systolic murmur RSB, no diastolic murur  Abdomen:  soft, non-tender, no masses   Extremities:  warm, well-perfused, pedal pulses palpable, no lower extremity edema  Rectal/GU  Deferred  Neuro:   Grossly non-focal and symmetrical throughout  Skin:   Clean and dry, no rashes, no breakdown  Diagnostic Tests:  Physicians  Panel Physicians Referring Physician Case Authorizing Physician  Kyra Phy, MD (Primary)     Procedures  RIGHT HEART CATH AND CORONARY ANGIOGRAPHY  PELVIC ANGIOGRAPHY   Conclusion      Mid LAD lesion is 40% stenosed.   1.   Mild to moderate nonobstructive coronary artery disease of right dominant system. 2.  Fick cardiac output of 7.0 L/min and Fick cardiac index of 4.0 L/min/m with the following hemodynamics:            Right atrial pressure mean of 13 mmHg            Right ventricular pressure 48/8 with an end-diastolic pressure of 13 mmHg            PA pressure 46/17 with a mean PA pressure of 29 mmHg            Mean wedge pressure of 13 mmHg with V waves to 20 mmHg            PVR of 2.3 Woods units            PA pulsatility index of 2.2 3.  Capacious iliofemoral vessels bilaterally.   Recommendation: Continue evaluation for aortic valve intervention.   Indications  Nonrheumatic aortic valve stenosis [I35.0 (ICD-10-CM)]   Procedural Details  Technical Details The patient is a 89 year old male with severe symptomatic aortic stenosis, type 2 diabetes mellitus, CKD stage II and a right bundle branch block seen in the outpatient setting due to lifestyle limiting dyspnea.  He is referred for right heart catheterization, coronary angiography study, and peripheral angiography study as part of an evaluation for an aortic valve intervention.  After obtaining consent, a previous placed antecubital IV was exchanged for 5 [place] Terumo glide sheath. Xylocaine  is used to anesthetize the right wrist and a 6 French Terumo glide sheath was placed.  5000 units heparin  and 5 mg verapamil  were administered through the sheath.  A 6 [place] TIG catheter was used for selective angiography of the right system and a 6 [place] XB LAD 3.5 guiding catheter was used for angiography of the left system.  5 French pigtail catheter was used for peripheral angiography; it was maneuvered to the distal abdominal aorta and abdominal aortography and bilateral  iliofemoral angiography was performed.  After review of the angiographic images and hemodynamic data, no further interventions were pursued.  A TR band was placed and manual pressure applied  to the antecubital site. Estimated blood loss <50 mL.   During this procedure medications were administered to achieve and maintain moderate conscious sedation while the patient's heart rate, blood pressure, and oxygen saturation were continuously monitored and I was present face-to-face 100% of this time. Jacinto Martins Rad Tech and Algie Antis RN  Alean Hunt Cardiovascular Specialist are independent, trained observers who assisted in the monitoring of the patient's level of consciousness.   Medications (Filter: Administrations occurring from 1113 to 1222 on 03/14/24) 0.9 %  sodium chloride  infusion (mL)  Total volume: 250 mL Date/Time Rate/Dose/Volume Action   03/14/24 1126 250 mL - 10 mL/hr New Bag/Given   fentaNYL  (SUBLIMAZE ) injection (mcg)  Total dose: 25 mcg Date/Time Rate/Dose/Volume Action   03/14/24 1135 25 mcg Given   midazolam  (VERSED ) injection (mg)  Total dose: 1 mg Date/Time Rate/Dose/Volume Action   03/14/24 1135 1 mg Given   Heparin  (Porcine) in NaCl 1000-0.9 UT/500ML-% SOLN (mL)  Total volume: 1,000 mL Date/Time Rate/Dose/Volume Action   03/14/24 1135 1,000 mL Given   lidocaine  (PF) (XYLOCAINE ) 1 % injection (mL)  Total volume: 5 mL Date/Time Rate/Dose/Volume Action   03/14/24 1143 5 mL Given   Radial Cocktail/Verapamil  only (mL)  Total volume: 10 mL Date/Time Rate/Dose/Volume Action   03/14/24 1145 10 mL Given   heparin  sodium (porcine) injection (Units)  Total dose: 5,000 Units Date/Time Rate/Dose/Volume Action   03/14/24 1145 5,000 Units Given   iohexol  (OMNIPAQUE ) 350 MG/ML injection (mL)  Total volume: 60 mL Date/Time Rate/Dose/Volume Action   03/14/24 1218 60 mL Given    Sedation Time  Sedation Time Physician-1: 36 minutes 53 seconds Contrast     Administrations occurring from 1113 to 1222 on 03/14/24:  Medication Name Total Dose  iohexol  (OMNIPAQUE ) 350 MG/ML injection 60 mL   Radiation/Fluoro  Fluoro time: 6.3 (min) DAP: 13.8  (Gycm2) Cumulative Air Kerma: 201 (mGy) Complications  Complications documented before study signed (03/14/2024 12:22 PM)   No complications were associated with this study.  Documented by Alinda Apley, RCIS - 03/14/2024 12:10 PM     Coronary Findings  Diagnostic Dominance: Right Left Anterior Descending  The vessel exhibits minimal luminal irregularities.  Mid LAD lesion is 40% stenosed.    Left Circumflex  The vessel exhibits minimal luminal irregularities.    Right Coronary Artery  The vessel exhibits minimal luminal irregularities.    Intervention   No interventions have been documented.   Coronary Diagrams  Diagnostic Dominance: Right  Intervention   Implants   No implant documentation for this case.   Syngo Images   Show images for CARDIAC CATHETERIZATION Images on Long Term Storage   Show images for Hazael, Olveda to Procedure Log  Procedure Log    Hemodynamics  Pressures Phases Resting  Right     RA Mean  mmHg 13    RA A-Wave  mmHg 17    RA V-Wave  mmHg 16  Pulmonary     PA  mmHg 46/17 (29)    PCW Mean  mmHg 13.0    PCW A-Wave  mmHg 20.0    PCW V-Wave  mmHg 16.0    PAPi   2.2    Saturations Phases Resting    PA  % 71    Arterial  % 96  Hemo Data  Flowsheet Row Most Recent Value  Fick Cardiac Output 7.02 L/min  Fick Cardiac Output Index 3.96 (L/min)/BSA  RA A Wave 17 mmHg  RA V Wave 16 mmHg  RA Mean 13 mmHg  RV Systolic Pressure 45 mmHg  RV Diastolic Pressure 8 mmHg  RV EDP 19 mmHg  PA Systolic Pressure 46 mmHg  PA Diastolic Pressure 17 mmHg  PA Mean 29 mmHg  PW A Wave 20 mmHg  PW V Wave 16 mmHg  PW Mean 13 mmHg  AO Systolic Pressure 141 mmHg  AO Diastolic Pressure 71 mmHg  AO Mean 97 mmHg  QP/QS 0.76  TPVR Index 9.64 HRUI  TSVR Index 19.7 HRUI  PVR SVR Ratio 0.32  TPVR/TSVR Ratio 0.49   ADDENDUM REPORT: 04/16/2024 17:44   EXAM: OVER-READ INTERPRETATION  CT CHEST   The following report is an  over-read performed by radiologist Dr. Chadwick Colonel of [facility] on 04/16/2024. This over-read does not include interpretation of cardiac or coronary anatomy or pathology. The coronary CTA interpretation by the cardiologist is attached.   COMPARISON:  Concurrent full field of view chest CTA, reported separately.   FINDINGS: Aortic atherosclerosis. No acute findings in the included portion of the thorax. Please reference same day full field of view chest CT for detailed assessment.   IMPRESSION: Aortic Atherosclerosis (ICD10-I70.0).     Electronically Signed   By: Chadwick Colonel M.D.   On: 04/16/2024 17:44    Addended by Katrina Parma, MD on 04/16/2024  5:47 PM    Study Result  Narrative & Impression  CLINICAL DATA:  46M with severe aortic stenosis being evaluated for a TAVR procedure.   EXAM: Cardiac TAVR CT   TECHNIQUE: A non-contrast, gated CT scan was obtained with axial slices of 2.5 mm through the heart for aortic valve scoring. A 120 kV retrospective, gated, contrast cardiac scan was obtained. Gantry rotation speed was 230 msec and collimation was 0.63 mm. Nitroglycerin was not given. A delayed scan was obtained to exclude left atrial appendage thrombus. The 3D dataset was reconstructed in systole with motion correction. The 3D data set was reconstructed in 5% intervals of the 0-95% of the R-R cycle. Systolic and diastolic phases were analyzed on a dedicated workstation using MPR, MIP, and VRT modes. The patient received 100 cc of contrast.   FINDINGS: Aortic Root:   Aortic valve: trileaflet   Aortic valve calcium score: 2021   Aortic annulus:   Diameter: 26mm x 21mm   Perimeter: 74mm   Area: 422 mm^2   Calcifications: No calcifications   Coronary height: Min Left - 13mm; Min Right - 13mm   Sinotubular height: Left cusp - 23mm; Right cusp - 21mm; Noncoronary cusp - 22mm   LVOT (as measured 3 mm below the annulus):    Diameter: 28mm x 21mm   Area: 445mm^2   Calcifications: No calcifications   Aortic sinus width: Left cusp - 36mm; Right cusp - 33mm; Noncoronary cusp - 32mm   Sinotubular junction width: 27mm x 26mm   Optimum Fluoroscopic Angle for Delivery: LAO 2 CRA 3   Cardiac:   Right atrium: Normal size   Right ventricle: Normal size   Pulmonary arteries: Normal size   Pulmonary veins: Normal configuration   Left atrium: Normal size   Left ventricle: Normal size   Pericardium: Normal thickness   Coronary arteries: Normal origins. Coronary calcium score 454 (percentile not available for age>84)   IMPRESSION: 1. Tricuspid aortic valve with  severe calcifications (AV calcium score 2021)   2. Aortic annulus measures 26mm x 21mm in diameter with perimeter 74mm and area 422 mm^2. No annular or LVOT calcifications. Annular measurements are suitable for delivery of 23mm Edwards Sapien 3 valve   3. Sufficient coronary to annulus distance   4. Optimum Fluoroscopic Angle for Delivery:  LAO 2 CRA 3   5. Coronary calcium score 454 (percentile not available for age>84)   Electronically Signed: By: Carson Clara M.D. On: 04/02/2024 21:05      Narrative & Impression  CLINICAL DATA:  Aortic valve replacement, preoperative evaluation   EXAM: CTA ABDOMEN AND PELVIS WITHOUT AND WITH CONTRAST   TECHNIQUE: Multidetector CT imaging of the abdomen and pelvis was performed using the standard protocol during bolus administration of intravenous contrast. Multiplanar reconstructed images and MIPs were obtained and reviewed to evaluate the vascular anatomy.   RADIATION DOSE REDUCTION: This exam was performed according to the departmental dose-optimization program which includes automated exposure control, adjustment of the mA and/or kV according to patient size and/or use of iterative reconstruction technique.   CONTRAST:  OMNIPAQUE  IOHEXOL  350 MG/ML SOLN   COMPARISON:   None Available.   FINDINGS: VASCULAR   Aorta: The abdominal aorta is patent with mild atherosclerotic changes. Minimal diameter measures 13 mm.   Celiac: Patent with mild atherosclerotic changes in the proximal segment.   SMA: Patent with mild atherosclerotic changes in the proximal segment.   Renals: Multiple renal arteries on the right, patent. The left main renal artery is also patent.   IMA: Patent.   Inflow: Right common iliac artery is patent with mild atherosclerotic changes. Minimal diameter measures 9 mm. The right internal iliac artery is patent. The right external iliac artery is tortuous with minimal diameter estimated at 9 mm.   The left common iliac artery is patent with mild atherosclerotic changes. Minimal diameter is 9 mm. Left internal iliac artery is patent. The left external iliac artery is tortuous and patent with minimal diameter measuring 7 mm.   Proximal Outflow: The right and left common femoral arteries are patent. Minimal diameters measure 8 mm.   Veins: No obvious venous abnormality within the limitations of this arterial phase study.   Review of the MIP images confirms the above findings.   NON-VASCULAR   Lower chest: Reported separately.   Hepatobiliary: Tiny gallstone in the dependent portion.   Pancreas: Nothing significant.   Spleen: Normal in size without focal abnormality.   Adrenals/Urinary Tract: Adrenal glands are unremarkable. Kidneys are normal, without renal calculi, focal lesion, or hydronephrosis. Bladder is unremarkable.   Stomach/Bowel: Nothing significant.   Lymphatic: Nothing significant.   Reproductive: Nothing significant.   Other: Nothing significant.   Musculoskeletal: No acute or significant osseous findings.   IMPRESSION: 1. No access issues identified.  Minimal diameter is detailed above.     Electronically Signed   By: Reagan Camera M.D.   On: 04/03/2024 07:53       Impression:  This  89 year old active gentleman has stage D, severe, symptomatic aortic stenosis with NYHA class II symptoms of exertional fatigue and shortness of breath consistent with chronic diastolic congestive heart failure.  His echocardiogram was done at an outside institution but reportedly showed a mean gradient of 41 mmHg across the aortic valve with a valve area of 0.82 cm consistent with severe aortic stenosis.  I have personally reviewed his cardiac catheterization and CTA studies.  Cardiac catheterization showed mild to moderate nonobstructive coronary  disease.  I agree that aortic valve replacement is indicated in this patient for relief of his symptoms and to prevent left ventricular dysfunction.  His gated cardiac CTA shows anatomy suitable for TAVR using a 23 mm SAPIEN 3 valve.  His abdominal and pelvic CTA shows adequate pelvic vascular anatomy to allow transfemoral insertion.  His preoperative ECG shows sinus rhythm with right bundle branch block.  The patient and his wife were counseled at length regarding treatment alternatives for management of severe symptomatic aortic stenosis. The risks and benefits of surgical intervention has been discussed in detail. Long-term prognosis with medical therapy was discussed. Alternative approaches such as conventional surgical aortic valve replacement, transcatheter aortic valve replacement, and palliative medical therapy were compared and contrasted at length. This discussion was placed in the context of the patient's own specific clinical presentation and past medical history. All of their questions have been addressed.   Following the decision to proceed with transcatheter aortic valve replacement, a discussion was held regarding what types of management strategies would be attempted intraoperatively in the event of life-threatening complications, including whether or not the patient would be considered a candidate for the use of cardiopulmonary bypass and/or  conversion to open sternotomy for attempted surgical intervention.  He is 89 years old but still very active and looks younger than his stated age.  I think he would be a candidate for emergent sternotomy to manage any intraoperative complications.  The patient has been advised of a variety of complications that might develop including but not limited to risks of death, stroke, paravalvular leak, aortic dissection or other major vascular complications, aortic annulus rupture, device embolization, cardiac rupture or perforation, mitral regurgitation, acute myocardial infarction, arrhythmia, heart block or bradycardia requiring permanent pacemaker placement, congestive heart failure, respiratory failure, renal failure, pneumonia, infection, other late complications related to structural valve deterioration or migration, or other complications that might ultimately cause a temporary or permanent loss of functional independence or other long term morbidity. The patient provides full informed consent for the procedure as described and all questions were answered.     Plan:  He will be scheduled for transfemoral TAVR using a SAPIEN 3 valve in the next few weeks.  We will plan to use a right internal jugular temporary pacemaker for the procedure.  I spent 60 minutes performing this consultation and > 50% of this time was spent face to face counseling and coordinating the care of this patient's severe aortic stenosis.   Bartley Lightning, MD 05/08/2024

## 2024-05-14 ENCOUNTER — Other Ambulatory Visit: Payer: Self-pay

## 2024-05-14 DIAGNOSIS — I350 Nonrheumatic aortic (valve) stenosis: Secondary | ICD-10-CM

## 2024-05-16 ENCOUNTER — Encounter (HOSPITAL_COMMUNITY): Payer: Self-pay

## 2024-05-17 ENCOUNTER — Other Ambulatory Visit: Payer: Self-pay

## 2024-05-17 ENCOUNTER — Inpatient Hospital Stay (HOSPITAL_COMMUNITY)

## 2024-05-17 ENCOUNTER — Encounter (HOSPITAL_COMMUNITY)
Admission: RE | Admit: 2024-05-17 | Discharge: 2024-05-17 | Disposition: A | Discharge status: Home / Self Care | Source: Ambulatory Visit | Attending: Internal Medicine | Admitting: Internal Medicine

## 2024-05-17 ENCOUNTER — Ambulatory Visit (HOSPITAL_COMMUNITY)
Admission: RE | Admit: 2024-05-17 | Discharge: 2024-05-17 | Disposition: A | Discharge status: Home / Self Care | Attending: Internal Medicine | Admitting: Internal Medicine

## 2024-05-17 DIAGNOSIS — Z01818 Encounter for other preprocedural examination: Secondary | ICD-10-CM | POA: Insufficient documentation

## 2024-05-17 DIAGNOSIS — I350 Nonrheumatic aortic (valve) stenosis: Principal | ICD-10-CM | POA: Insufficient documentation

## 2024-05-17 DIAGNOSIS — Z006 Encounter for examination for normal comparison and control in clinical research program: Secondary | ICD-10-CM | POA: Insufficient documentation

## 2024-05-17 HISTORY — DX: Type 2 diabetes mellitus without complications: E11.9

## 2024-05-17 LAB — CBC
HCT: 39.1 % (ref 39.0–52.0)
Hemoglobin: 12.9 g/dL — ABNORMAL LOW (ref 13.0–17.0)
MCH: 29.7 pg (ref 26.0–34.0)
MCHC: 33 g/dL (ref 30.0–36.0)
MCV: 89.9 fL (ref 80.0–100.0)
Platelets: 343 10*3/uL (ref 150–400)
RBC: 4.35 MIL/uL (ref 4.22–5.81)
RDW: 13.7 % (ref 11.5–15.5)
WBC: 6.6 10*3/uL (ref 4.0–10.5)
nRBC: 0 % (ref 0.0–0.2)

## 2024-05-17 LAB — URINALYSIS, ROUTINE W REFLEX MICROSCOPIC
Bilirubin Urine: NEGATIVE
Glucose, UA: NEGATIVE mg/dL
Hgb urine dipstick: NEGATIVE
Ketones, ur: NEGATIVE mg/dL
Leukocytes,Ua: NEGATIVE
Nitrite: NEGATIVE
Protein, ur: NEGATIVE mg/dL
Specific Gravity, Urine: 1.015 (ref 1.005–1.030)
pH: 5 (ref 5.0–8.0)

## 2024-05-17 LAB — COMPREHENSIVE METABOLIC PANEL WITH GFR
ALT: 32 U/L (ref 0–44)
AST: 30 U/L (ref 15–41)
Albumin: 4.2 g/dL (ref 3.5–5.0)
Alkaline Phosphatase: 64 U/L (ref 38–126)
Anion gap: 11 (ref 5–15)
BUN: 11 mg/dL (ref 8–23)
CO2: 27 mmol/L (ref 22–32)
Calcium: 9.8 mg/dL (ref 8.9–10.3)
Chloride: 101 mmol/L (ref 98–111)
Creatinine, Ser: 0.93 mg/dL (ref 0.61–1.24)
GFR, Estimated: >60 mL/min (ref >60)
Glucose, Bld: 117 mg/dL — ABNORMAL HIGH (ref 70–99)
Potassium: 4.5 mmol/L (ref 3.5–5.1)
Sodium: 139 mmol/L (ref 135–145)
Total Bilirubin: 0.5 mg/dL (ref 0.0–1.2)
Total Protein: 8.1 g/dL (ref 6.5–8.1)

## 2024-05-17 LAB — SURGICAL PCR SCREEN
MRSA, PCR: NEGATIVE
Staphylococcus aureus: NEGATIVE

## 2024-05-17 LAB — TYPE AND SCREEN
ABO/RH(D): O NEG
Antibody Screen: NEGATIVE

## 2024-05-17 LAB — PROTIME-INR
INR: 1 (ref 0.8–1.2)
Prothrombin Time: 13.7 s (ref 11.4–15.2)

## 2024-05-17 NOTE — Progress Notes (Signed)
 Patient signed all consents at PAT lab appointment. CHG soap and instructions were given to patient. CHG surgical prep reviewed with patient and all questions answered.  Patients chart send to anesthesia for review. Pt denies any respiratory illness/infection in the last two months.

## 2024-05-20 ENCOUNTER — Encounter (HOSPITAL_COMMUNITY): Payer: Self-pay | Admitting: Certified Registered"

## 2024-05-20 MED ORDER — MAGNESIUM SULFATE 50 % IJ SOLN
40.0000 meq | INTRAMUSCULAR | Status: DC
  Filled 2024-05-20 (×2): qty 9.85

## 2024-05-20 MED ORDER — DEXMEDETOMIDINE HCL IN NACL 400 MCG/100ML IV SOLN
0.1000 ug/kg/h | INTRAVENOUS | Status: DC
  Filled 2024-05-20: qty 100

## 2024-05-20 MED ORDER — CEFAZOLIN SODIUM-DEXTROSE 2-4 GM/100ML-% IV SOLN
2.0000 g | INTRAVENOUS | Status: DC
  Filled 2024-05-20: qty 100

## 2024-05-20 MED ORDER — POTASSIUM CHLORIDE 2 MEQ/ML IV SOLN
80.0000 meq | INTRAVENOUS | Status: DC
  Filled 2024-05-20 (×2): qty 40

## 2024-05-20 MED ORDER — NOREPINEPHRINE 4 MG/250ML-% IV SOLN
0.0000 ug/min | INTRAVENOUS | Status: DC
  Filled 2024-05-20: qty 250

## 2024-05-20 MED ORDER — HEPARIN 30,000 UNITS/1000 ML (OHS) CELLSAVER SOLUTION
Status: DC
  Filled 2024-05-20 (×2): qty 1000

## 2024-05-20 NOTE — H&P (View-Only) (Signed)
 [address], [place] [address]             [phone]     CARDIOTHORACIC SURGERY ADMISSION HISTORY AND PHYSICAL   PCP is Karena Ota, MD Referring Provider is Alyssa Backbone, MD Primary Cardiologist is None   Reason for admission:  Severe aortic stenosis   HPI:   The patient is an 89 year old gentleman with history of type 2 diabetes, stage II chronic kidney disease, and aortic stenosis who was referred for consideration of aortic valve replacement.  He presented with a history of gradually increasing fatigue over the past couple years and then had an episode of syncope over [facility] weekend.  He was walking up some stairs and blacked out falling down the stairs.  He does not remember getting dizzy.  Luckily he did not suffer any major injury.  He subsequently had a 2D echocardiogram that showed a mean gradient of 41 mmHg with a valve area of 0.82 cm.  Left ventricular ejection fraction was 60 to 65%.   He is married and lives with his wife.  He has noted gradual onset of exertional fatigue and some shortness of breath.  He has had no chest pain.  He had no history of prior dizziness or syncope.  He denies orthopnea and peripheral edema.        Past Surgical History:  Procedure Laterality Date   PELVIC ANGIOGRAPHY   03/14/2024    Procedure: PELVIC ANGIOGRAPHY;  Surgeon: Kyra Phy, MD;  Location: [facility];  Service: Cardiovascular;;   RIGHT HEART CATH AND CORONARY ANGIOGRAPHY N/A 03/14/2024    Procedure: RIGHT HEART CATH AND CORONARY ANGIOGRAPHY;  Surgeon: Kyra Phy, MD;  Location: [facility];  Service: Cardiovascular;  Laterality: N/A;          No family history on file.       Social History         Socioeconomic History   Marital status: Married      Spouse name: Not on file   Number of children: Not on file   Years of education: Not on file   Highest education level: Not on file  Occupational History   Not  on file  Tobacco Use   Smoking status: Never   Smokeless tobacco: Never  Substance and Sexual Activity   Alcohol use: Not on file   Drug use: Not on file   Sexual activity: Not on file  Other Topics Concern   Not on file  Social History Narrative   Not on file    Social Drivers of Health        Financial Resource Strain: Not on file  Food Insecurity: Low Risk  (12/14/2023)    Received from [facility]    Hunger Vital Sign     Worried About Running Out of Food in the Last Year: Never true     Ran Out of Food in the Last Year: Never true  Transportation Needs: No Transportation Needs (12/14/2023)    Received from [employer]     In the past 12 months, has lack of reliable transportation kept you from medical appointments, meetings, work or from getting things needed for daily living? : No  Physical Activity: Not on file  Stress: Not on file  Social Connections: Not on file  Intimate Partner Violence: Not on file  Prior to Admission medications   Medication Sig Start Date End Date Taking? Authorizing Provider  aspirin  EC 81 MG tablet Take 1 tablet (81 mg total) by mouth daily. Swallow whole. 03/07/24   Yes Thukkani, Arun K, MD  cyanocobalamin (VITAMIN B12) 1000 MCG tablet Take 1,000 mcg by mouth daily.     Yes [provider]  ezetimibe (ZETIA) 10 MG tablet Take 10 mg by mouth daily.     Yes [provider]  metFORMIN (GLUCOPHAGE-XR) 500 MG 24 hr tablet Take 1,000 mg by mouth 2 (two) times daily with a meal. 03/19/17   Yes [provider]  Multiple Vitamins-Minerals (MULTIVITAMIN WITH MINERALS) tablet Take 1 tablet by mouth daily.     Yes [provider]  trimethoprim-polymyxin b (POLYTRIM) ophthalmic solution Place 1 drop into the left eye See admin instructions. Receives eye injection about every 5-6 weeks, uses eye drops after procedure for that day     Yes [provider]            Current Outpatient  Medications  Medication Sig Dispense Refill   aspirin  EC 81 MG tablet Take 1 tablet (81 mg total) by mouth daily. Swallow whole.       cyanocobalamin (VITAMIN B12) 1000 MCG tablet Take 1,000 mcg by mouth daily.       ezetimibe (ZETIA) 10 MG tablet Take 10 mg by mouth daily.       metFORMIN (GLUCOPHAGE-XR) 500 MG 24 hr tablet Take 1,000 mg by mouth 2 (two) times daily with a meal.       Multiple Vitamins-Minerals (MULTIVITAMIN WITH MINERALS) tablet Take 1 tablet by mouth daily.       trimethoprim-polymyxin b (POLYTRIM) ophthalmic solution Place 1 drop into the left eye See admin instructions. Receives eye injection about every 5-6 weeks, uses eye drops after procedure for that day          No current facility-administered medications for this visit.        Allergies       Allergies  Allergen Reactions   Demerol [Meperidine Hcl]        Does not remember reaction    Fluorescein  Nausea And Vomiting      Retinal dye injection    Pravastatin Sodium        Does not remember reaction    Septra [Sulfamethoxazole-Trimethoprim]        Does not remember reaction             Review of Systems:               General:                      normal appetite, + decreased energy, no weight gain, no weight loss, no fever             Cardiac:                       no chest pain with exertion, no chest pain at rest, +SOB with moderate exertion, no resting SOB, no PND, no orthopnea, no palpitations, no arrhythmia, no atrial fibrillation, no LE edema, no dizzy spells, + single episode of syncope             Respiratory:                 + exertional shortness of breath, no home oxygen, no productive cough, no dry cough, no bronchitis,  no wheezing, no hemoptysis, no asthma, no pain with inspiration or cough, no sleep apnea, no CPAP at night             GI:                               no difficulty swallowing, no reflux, no frequent heartburn, no hiatal hernia, no abdominal pain, no constipation, no  diarrhea, no hematochezia, no hematemesis, no melena             GU:                              no dysuria,  no frequency, no urinary tract infection, no hematuria, no enlarged prostate, no kidney stones, + chronic kidney disease             Vascular:                     no pain suggestive of claudication, no pain in feet, no leg cramps, no varicose veins, no DVT, no non-healing foot ulcer             Neuro:                         no stroke, no TIA's, no seizures, no headaches, no temporary blindness one eye,  no slurred speech, no peripheral neuropathy, no chronic pain, no instability of gait, no memory/cognitive dysfunction             Musculoskeletal:         no arthritis, no joint swelling, no myalgias, no difficulty walking, normal mobility              Skin:                            no rash, no itching, no skin infections, no pressure sores or ulcerations             Psych:                         no anxiety, no depression, no nervousness, no unusual recent stress             Eyes:                           no blurry vision, no floaters, no recent vision changes, + wears glasses or contacts             ENT:                            no hearing loss, no loose or painful teeth, no dentures, last saw dentist this year             Hematologic:               no easy bruising, no abnormal bleeding, no clotting disorder, no frequent epistaxis             Endocrine:                   + diabetes, does check CBG's at home  Physical Exam:               BP 138/78   Pulse 81   Resp 18   Ht 5' 4 (1.626 m)   Wt 151 lb (68.5 kg)   SpO2 98%   BMI 25.92 kg/m              General:                      Elderly,  well-appearing             HEENT:                       Unremarkable, NCAT, PERLA, EOMI             Neck:                           no JVD, no bruits, no adenopathy              Chest:                          clear to auscultation, symmetrical breath sounds, no  wheezes, no rhonchi              CV:                              RRR, 3/6 systolic murmur RSB, no diastolic murur             Abdomen:                    soft, non-tender, no masses              Extremities:                 warm, well-perfused, pedal pulses palpable, no lower extremity edema             Rectal/GU                   Deferred             Neuro:                         Grossly non-focal and symmetrical throughout             Skin:                            Clean and dry, no rashes, no breakdown   Diagnostic Tests:   Physicians   Panel Physicians Referring Physician Case Authorizing Physician  Kyra Phy, MD (Primary)        Procedures   RIGHT HEART CATH AND CORONARY ANGIOGRAPHY  PELVIC ANGIOGRAPHY    Conclusion       Mid LAD lesion is 40% stenosed.   1.  Mild to moderate nonobstructive coronary artery disease of right dominant system. 2.  Fick cardiac output of 7.0 L/min and Fick cardiac index of 4.0 L/min/m with the following hemodynamics:            Right atrial pressure mean of 13 mmHg            Right ventricular pressure 48/8 with an end-diastolic pressure of 13 mmHg  PA pressure 46/17 with a mean PA pressure of 29 mmHg            Mean wedge pressure of 13 mmHg with V waves to 20 mmHg            PVR of 2.3 Woods units            PA pulsatility index of 2.2 3.  Capacious iliofemoral vessels bilaterally.   Recommendation: Continue evaluation for aortic valve intervention.   Indications   Nonrheumatic aortic valve stenosis [I35.0 (ICD-10-CM)]    Procedural Details   Technical Details The patient is a 89 year old male with severe symptomatic aortic stenosis, type 2 diabetes mellitus, CKD stage II and a right bundle branch block seen in the outpatient setting due to lifestyle limiting dyspnea.  He is referred for right heart catheterization, coronary angiography study, and peripheral angiography study as part of an evaluation for an aortic  valve intervention.  After obtaining consent, a previous placed antecubital IV was exchanged for 5 [place] Terumo glide sheath. Xylocaine  is used to anesthetize the right wrist and a 6 French Terumo glide sheath was placed.  5000 units heparin  and 5 mg verapamil  were administered through the sheath.  A 6 [place] TIG catheter was used for selective angiography of the right system and a 6 [place] XB LAD 3.5 guiding catheter was used for angiography of the left system.  5 French pigtail catheter was used for peripheral angiography; it was maneuvered to the distal abdominal aorta and abdominal aortography and bilateral iliofemoral angiography was performed.  After review of the angiographic images and hemodynamic data, no further interventions were pursued.  A TR band was placed and manual pressure applied to the antecubital site. Estimated blood loss <50 mL.   During this procedure medications were administered to achieve and maintain moderate conscious sedation while the patient's heart rate, blood pressure, and oxygen saturation were continuously monitored and I was present face-to-face 100% of this time. Jacinto Martins Rad Tech and Algie Antis RN  Alean Hunt Cardiovascular Specialist are independent, trained observers who assisted in the monitoring of the patient's level of consciousness.    Medications (Filter: Administrations occurring from 1113 to 1222 on 03/14/24) 0.9 %  sodium chloride  infusion (mL)  Total volume: 250 mL Date/Time Rate/Dose/Volume Action    03/14/24 1126 250 mL - 10 mL/hr New Bag/Given    fentaNYL  (SUBLIMAZE ) injection (mcg)  Total dose: 25 mcg Date/Time Rate/Dose/Volume Action    03/14/24 1135 25 mcg Given    midazolam  (VERSED ) injection (mg)  Total dose: 1 mg Date/Time Rate/Dose/Volume Action    03/14/24 1135 1 mg Given    Heparin  (Porcine) in NaCl 1000-0.9 UT/500ML-% SOLN (mL)  Total volume: 1,000 mL Date/Time Rate/Dose/Volume Action    03/14/24 1135 1,000 mL  Given    lidocaine  (PF) (XYLOCAINE ) 1 % injection (mL)  Total volume: 5 mL Date/Time Rate/Dose/Volume Action    03/14/24 1143 5 mL Given    Radial Cocktail/Verapamil  only (mL)  Total volume: 10 mL Date/Time Rate/Dose/Volume Action    03/14/24 1145 10 mL Given    heparin  sodium (porcine) injection (Units)  Total dose: 5,000 Units Date/Time Rate/Dose/Volume Action    03/14/24 1145 5,000 Units Given    iohexol  (OMNIPAQUE ) 350 MG/ML injection (mL)  Total volume: 60 mL Date/Time Rate/Dose/Volume Action    03/14/24 1218 60 mL Given      Sedation Time   Sedation Time Physician-1: 36 minutes 53 seconds Contrast  Administrations occurring from 1113 to 1222 on 03/14/24:  Medication Name Total Dose  iohexol  (OMNIPAQUE ) 350 MG/ML injection 60 mL    Radiation/Fluoro   Fluoro time: 6.3 (min) DAP: 13.8 (Gycm2) Cumulative Air Kerma: 201 (mGy) Complications   Complications documented before study signed (03/14/2024 12:22 PM)    No complications were associated with this study.  Documented by Alinda Apley, RCIS - 03/14/2024 12:10 PM      Coronary Findings   Diagnostic Dominance: Right Left Anterior Descending  The vessel exhibits minimal luminal irregularities.  Mid LAD lesion is 40% stenosed.    Left Circumflex  The vessel exhibits minimal luminal irregularities.    Right Coronary Artery  The vessel exhibits minimal luminal irregularities.    Intervention    No interventions have been documented.    Coronary Diagrams   Diagnostic Dominance: Right  Intervention    Implants    No implant documentation for this case.    Syngo Images    Show images for CARDIAC CATHETERIZATION Images on Long Term Storage    Show images for Dixon, Luczak to Procedure Log   Procedure Log    Hemodynamics   Pressures Phases Resting  Right      RA Mean  mmHg 13    RA A-Wave  mmHg 17    RA V-Wave  mmHg 16  Pulmonary      PA  mmHg 46/17 (29)    PCW Mean   mmHg 13.0    PCW A-Wave  mmHg 20.0    PCW V-Wave  mmHg 16.0    PAPi   2.2    Saturations Phases Resting    PA  % 71    Arterial  % 96    Hemo Data   Flowsheet Row Most Recent Value  Fick Cardiac Output 7.02 L/min  Fick Cardiac Output Index 3.96 (L/min)/BSA  RA A Wave 17 mmHg  RA V Wave 16 mmHg  RA Mean 13 mmHg  RV Systolic Pressure 45 mmHg  RV Diastolic Pressure 8 mmHg  RV EDP 19 mmHg  PA Systolic Pressure 46 mmHg  PA Diastolic Pressure 17 mmHg  PA Mean 29 mmHg  PW A Wave 20 mmHg  PW V Wave 16 mmHg  PW Mean 13 mmHg  AO Systolic Pressure 141 mmHg  AO Diastolic Pressure 71 mmHg  AO Mean 97 mmHg  QP/QS 0.76  TPVR Index 9.64 HRUI  TSVR Index 19.7 HRUI  PVR SVR Ratio 0.32  TPVR/TSVR Ratio 0.49    ADDENDUM REPORT: 04/16/2024 17:44   EXAM: OVER-READ INTERPRETATION  CT CHEST   The following report is an over-read performed by radiologist Dr. Chadwick Colonel of [facility] on 04/16/2024. This over-read does not include interpretation of cardiac or coronary anatomy or pathology. The coronary CTA interpretation by the cardiologist is attached.   COMPARISON:  Concurrent full field of view chest CTA, reported separately.   FINDINGS: Aortic atherosclerosis. No acute findings in the included portion of the thorax. Please reference same day full field of view chest CT for detailed assessment.   IMPRESSION: Aortic Atherosclerosis (ICD10-I70.0).     Electronically Signed   By: Chadwick Colonel M.D.   On: 04/16/2024 17:44    Addended by Katrina Parma, MD on 04/16/2024  5:47 PM    Study Result   Narrative & Impression  CLINICAL DATA:  60M with severe aortic stenosis being evaluated for a TAVR procedure.   EXAM: Cardiac TAVR CT   TECHNIQUE:  A non-contrast, gated CT scan was obtained with axial slices of 2.5 mm through the heart for aortic valve scoring. A 120 kV retrospective, gated, contrast cardiac scan was obtained. Gantry rotation speed was  230 msec and collimation was 0.63 mm. Nitroglycerin was not given. A delayed scan was obtained to exclude left atrial appendage thrombus. The 3D dataset was reconstructed in systole with motion correction. The 3D data set was reconstructed in 5% intervals of the 0-95% of the R-R cycle. Systolic and diastolic phases were analyzed on a dedicated workstation using MPR, MIP, and VRT modes. The patient received 100 cc of contrast.   FINDINGS: Aortic Root:   Aortic valve: trileaflet   Aortic valve calcium score: 2021   Aortic annulus:   Diameter: 26mm x 21mm   Perimeter: 74mm   Area: 422 mm^2   Calcifications: No calcifications   Coronary height: Min Left - 13mm; Min Right - 13mm   Sinotubular height: Left cusp - 23mm; Right cusp - 21mm; Noncoronary cusp - 22mm   LVOT (as measured 3 mm below the annulus):   Diameter: 28mm x 21mm   Area: 420mm^2   Calcifications: No calcifications   Aortic sinus width: Left cusp - 36mm; Right cusp - 33mm; Noncoronary cusp - 32mm   Sinotubular junction width: 27mm x 26mm   Optimum Fluoroscopic Angle for Delivery: LAO 2 CRA 3   Cardiac:   Right atrium: Normal size   Right ventricle: Normal size   Pulmonary arteries: Normal size   Pulmonary veins: Normal configuration   Left atrium: Normal size   Left ventricle: Normal size   Pericardium: Normal thickness   Coronary arteries: Normal origins. Coronary calcium score 454 (percentile not available for age>84)   IMPRESSION: 1. Tricuspid aortic valve with severe calcifications (AV calcium score 2021)   2. Aortic annulus measures 26mm x 21mm in diameter with perimeter 74mm and area 422 mm^2. No annular or LVOT calcifications. Annular measurements are suitable for delivery of 23mm Edwards Sapien 3 valve   3. Sufficient coronary to annulus distance   4. Optimum Fluoroscopic Angle for Delivery:  LAO 2 CRA 3   5. Coronary calcium score 454 (percentile not available for  age>84)   Electronically Signed: By: Carson Clara M.D. On: 04/02/2024 21:05        Narrative & Impression  CLINICAL DATA:  Aortic valve replacement, preoperative evaluation   EXAM: CTA ABDOMEN AND PELVIS WITHOUT AND WITH CONTRAST   TECHNIQUE: Multidetector CT imaging of the abdomen and pelvis was performed using the standard protocol during bolus administration of intravenous contrast. Multiplanar reconstructed images and MIPs were obtained and reviewed to evaluate the vascular anatomy.   RADIATION DOSE REDUCTION: This exam was performed according to the departmental dose-optimization program which includes automated exposure control, adjustment of the mA and/or kV according to patient size and/or use of iterative reconstruction technique.   CONTRAST:  OMNIPAQUE  IOHEXOL  350 MG/ML SOLN   COMPARISON:  None Available.   FINDINGS: VASCULAR   Aorta: The abdominal aorta is patent with mild atherosclerotic changes. Minimal diameter measures 13 mm.   Celiac: Patent with mild atherosclerotic changes in the proximal segment.   SMA: Patent with mild atherosclerotic changes in the proximal segment.   Renals: Multiple renal arteries on the right, patent. The left main renal artery is also patent.   IMA: Patent.   Inflow: Right common iliac artery is patent with mild atherosclerotic changes. Minimal diameter measures 9 mm. The right internal iliac artery is  patent. The right external iliac artery is tortuous with minimal diameter estimated at 9 mm.   The left common iliac artery is patent with mild atherosclerotic changes. Minimal diameter is 9 mm. Left internal iliac artery is patent. The left external iliac artery is tortuous and patent with minimal diameter measuring 7 mm.   Proximal Outflow: The right and left common femoral arteries are patent. Minimal diameters measure 8 mm.   Veins: No obvious venous abnormality within the limitations of  this arterial phase study.   Review of the MIP images confirms the above findings.   NON-VASCULAR   Lower chest: Reported separately.   Hepatobiliary: Tiny gallstone in the dependent portion.   Pancreas: Nothing significant.   Spleen: Normal in size without focal abnormality.   Adrenals/Urinary Tract: Adrenal glands are unremarkable. Kidneys are normal, without renal calculi, focal lesion, or hydronephrosis. Bladder is unremarkable.   Stomach/Bowel: Nothing significant.   Lymphatic: Nothing significant.   Reproductive: Nothing significant.   Other: Nothing significant.   Musculoskeletal: No acute or significant osseous findings.   IMPRESSION: 1. No access issues identified.  Minimal diameter is detailed above.     Electronically Signed   By: Reagan Camera M.D.   On: 04/03/2024 07:53          Impression:   This 89 year old active gentleman has stage D, severe, symptomatic aortic stenosis with NYHA class II symptoms of exertional fatigue and shortness of breath consistent with chronic diastolic congestive heart failure.  His echocardiogram was done at an outside institution but reportedly showed a mean gradient of 41 mmHg across the aortic valve with a valve area of 0.82 cm consistent with severe aortic stenosis.  I have personally reviewed his cardiac catheterization and CTA studies.  Cardiac catheterization showed mild to moderate nonobstructive coronary disease.  I agree that aortic valve replacement is indicated in this patient for relief of his symptoms and to prevent left ventricular dysfunction.  His gated cardiac CTA shows anatomy suitable for TAVR using a 23 mm SAPIEN 3 valve.  His abdominal and pelvic CTA shows adequate pelvic vascular anatomy to allow transfemoral insertion.  His preoperative ECG shows sinus rhythm with right bundle branch block.   The patient and his wife were counseled at length regarding treatment alternatives for management of severe  symptomatic aortic stenosis. The risks and benefits of surgical intervention has been discussed in detail. Long-term prognosis with medical therapy was discussed. Alternative approaches such as conventional surgical aortic valve replacement, transcatheter aortic valve replacement, and palliative medical therapy were compared and contrasted at length. This discussion was placed in the context of the patient's own specific clinical presentation and past medical history. All of their questions have been addressed.    Following the decision to proceed with transcatheter aortic valve replacement, a discussion was held regarding what types of management strategies would be attempted intraoperatively in the event of life-threatening complications, including whether or not the patient would be considered a candidate for the use of cardiopulmonary bypass and/or conversion to open sternotomy for attempted surgical intervention.  He is 89 years old but still very active and looks younger than his stated age.  I think he would be a candidate for emergent sternotomy to manage any intraoperative complications.  The patient has been advised of a variety of complications that might develop including but not limited to risks of death, stroke, paravalvular leak, aortic dissection or other major vascular complications, aortic annulus rupture, device embolization, cardiac rupture or perforation, mitral  regurgitation, acute myocardial infarction, arrhythmia, heart block or bradycardia requiring permanent pacemaker placement, congestive heart failure, respiratory failure, renal failure, pneumonia, infection, other late complications related to structural valve deterioration or migration, or other complications that might ultimately cause a temporary or permanent loss of functional independence or other long term morbidity. The patient provides full informed consent for the procedure as described and all questions were answered.      Plan:   Transfemoral TAVR using a SAPIEN 3 valve.  We will plan to use a right internal jugular temporary pacemaker for the procedure.     Bartley Lightning, MD

## 2024-05-20 NOTE — H&P (Signed)
 [address], [place] [address]             [phone]     CARDIOTHORACIC SURGERY ADMISSION HISTORY AND PHYSICAL   PCP is Karena Ota, MD Referring Provider is Alyssa Backbone, MD Primary Cardiologist is None   Reason for admission:  Severe aortic stenosis   HPI:   The patient is an 89 year old gentleman with history of type 2 diabetes, stage II chronic kidney disease, and aortic stenosis who was referred for consideration of aortic valve replacement.  He presented with a history of gradually increasing fatigue over the past couple years and then had an episode of syncope over [facility] weekend.  He was walking up some stairs and blacked out falling down the stairs.  He does not remember getting dizzy.  Luckily he did not suffer any major injury.  He subsequently had a 2D echocardiogram that showed a mean gradient of 41 mmHg with a valve area of 0.82 cm.  Left ventricular ejection fraction was 60 to 65%.   He is married and lives with his wife.  He has noted gradual onset of exertional fatigue and some shortness of breath.  He has had no chest pain.  He had no history of prior dizziness or syncope.  He denies orthopnea and peripheral edema.        Past Surgical History:  Procedure Laterality Date   PELVIC ANGIOGRAPHY   03/14/2024    Procedure: PELVIC ANGIOGRAPHY;  Surgeon: Kyra Phy, MD;  Location: [facility];  Service: Cardiovascular;;   RIGHT HEART CATH AND CORONARY ANGIOGRAPHY N/A 03/14/2024    Procedure: RIGHT HEART CATH AND CORONARY ANGIOGRAPHY;  Surgeon: Kyra Phy, MD;  Location: [facility];  Service: Cardiovascular;  Laterality: N/A;          No family history on file.       Social History         Socioeconomic History   Marital status: Married      Spouse name: Not on file   Number of children: Not on file   Years of education: Not on file   Highest education level: Not on file  Occupational History   Not  on file  Tobacco Use   Smoking status: Never   Smokeless tobacco: Never  Substance and Sexual Activity   Alcohol use: Not on file   Drug use: Not on file   Sexual activity: Not on file  Other Topics Concern   Not on file  Social History Narrative   Not on file    Social Drivers of Health        Financial Resource Strain: Not on file  Food Insecurity: Low Risk  (12/14/2023)    Received from [facility]    Hunger Vital Sign     Worried About Running Out of Food in the Last Year: Never true     Ran Out of Food in the Last Year: Never true  Transportation Needs: No Transportation Needs (12/14/2023)    Received from [employer]     In the past 12 months, has lack of reliable transportation kept you from medical appointments, meetings, work or from getting things needed for daily living? : No  Physical Activity: Not on file  Stress: Not on file  Social Connections: Not on file  Intimate Partner Violence: Not on file  Prior to Admission medications   Medication Sig Start Date End Date Taking? Authorizing Provider  aspirin  EC 81 MG tablet Take 1 tablet (81 mg total) by mouth daily. Swallow whole. 03/07/24   Yes Thukkani, Arun K, MD  cyanocobalamin (VITAMIN B12) 1000 MCG tablet Take 1,000 mcg by mouth daily.     Yes [provider]  ezetimibe (ZETIA) 10 MG tablet Take 10 mg by mouth daily.     Yes [provider]  metFORMIN (GLUCOPHAGE-XR) 500 MG 24 hr tablet Take 1,000 mg by mouth 2 (two) times daily with a meal. 03/19/17   Yes [provider]  Multiple Vitamins-Minerals (MULTIVITAMIN WITH MINERALS) tablet Take 1 tablet by mouth daily.     Yes [provider]  trimethoprim-polymyxin b (POLYTRIM) ophthalmic solution Place 1 drop into the left eye See admin instructions. Receives eye injection about every 5-6 weeks, uses eye drops after procedure for that day     Yes [provider]            Current Outpatient  Medications  Medication Sig Dispense Refill   aspirin  EC 81 MG tablet Take 1 tablet (81 mg total) by mouth daily. Swallow whole.       cyanocobalamin (VITAMIN B12) 1000 MCG tablet Take 1,000 mcg by mouth daily.       ezetimibe (ZETIA) 10 MG tablet Take 10 mg by mouth daily.       metFORMIN (GLUCOPHAGE-XR) 500 MG 24 hr tablet Take 1,000 mg by mouth 2 (two) times daily with a meal.       Multiple Vitamins-Minerals (MULTIVITAMIN WITH MINERALS) tablet Take 1 tablet by mouth daily.       trimethoprim-polymyxin b (POLYTRIM) ophthalmic solution Place 1 drop into the left eye See admin instructions. Receives eye injection about every 5-6 weeks, uses eye drops after procedure for that day          No current facility-administered medications for this visit.        Allergies       Allergies  Allergen Reactions   Demerol [Meperidine Hcl]        Does not remember reaction    Fluorescein  Nausea And Vomiting      Retinal dye injection    Pravastatin Sodium        Does not remember reaction    Septra [Sulfamethoxazole-Trimethoprim]        Does not remember reaction             Review of Systems:               General:                      normal appetite, + decreased energy, no weight gain, no weight loss, no fever             Cardiac:                       no chest pain with exertion, no chest pain at rest, +SOB with moderate exertion, no resting SOB, no PND, no orthopnea, no palpitations, no arrhythmia, no atrial fibrillation, no LE edema, no dizzy spells, + single episode of syncope             Respiratory:                 + exertional shortness of breath, no home oxygen, no productive cough, no dry cough, no bronchitis,  no wheezing, no hemoptysis, no asthma, no pain with inspiration or cough, no sleep apnea, no CPAP at night             GI:                               no difficulty swallowing, no reflux, no frequent heartburn, no hiatal hernia, no abdominal pain, no constipation, no  diarrhea, no hematochezia, no hematemesis, no melena             GU:                              no dysuria,  no frequency, no urinary tract infection, no hematuria, no enlarged prostate, no kidney stones, + chronic kidney disease             Vascular:                     no pain suggestive of claudication, no pain in feet, no leg cramps, no varicose veins, no DVT, no non-healing foot ulcer             Neuro:                         no stroke, no TIA's, no seizures, no headaches, no temporary blindness one eye,  no slurred speech, no peripheral neuropathy, no chronic pain, no instability of gait, no memory/cognitive dysfunction             Musculoskeletal:         no arthritis, no joint swelling, no myalgias, no difficulty walking, normal mobility              Skin:                            no rash, no itching, no skin infections, no pressure sores or ulcerations             Psych:                         no anxiety, no depression, no nervousness, no unusual recent stress             Eyes:                           no blurry vision, no floaters, no recent vision changes, + wears glasses or contacts             ENT:                            no hearing loss, no loose or painful teeth, no dentures, last saw dentist this year             Hematologic:               no easy bruising, no abnormal bleeding, no clotting disorder, no frequent epistaxis             Endocrine:                   + diabetes, does check CBG's at home  Physical Exam:               BP 138/78   Pulse 81   Resp 18   Ht 5' 4 (1.626 m)   Wt 151 lb (68.5 kg)   SpO2 98%   BMI 25.92 kg/m              General:                      Elderly,  well-appearing             HEENT:                       Unremarkable, NCAT, PERLA, EOMI             Neck:                           no JVD, no bruits, no adenopathy              Chest:                          clear to auscultation, symmetrical breath sounds, no  wheezes, no rhonchi              CV:                              RRR, 3/6 systolic murmur RSB, no diastolic murur             Abdomen:                    soft, non-tender, no masses              Extremities:                 warm, well-perfused, pedal pulses palpable, no lower extremity edema             Rectal/GU                   Deferred             Neuro:                         Grossly non-focal and symmetrical throughout             Skin:                            Clean and dry, no rashes, no breakdown   Diagnostic Tests:   Physicians   Panel Physicians Referring Physician Case Authorizing Physician  Kyra Phy, MD (Primary)        Procedures   RIGHT HEART CATH AND CORONARY ANGIOGRAPHY  PELVIC ANGIOGRAPHY    Conclusion       Mid LAD lesion is 40% stenosed.   1.  Mild to moderate nonobstructive coronary artery disease of right dominant system. 2.  Fick cardiac output of 7.0 L/min and Fick cardiac index of 4.0 L/min/m with the following hemodynamics:            Right atrial pressure mean of 13 mmHg            Right ventricular pressure 48/8 with an end-diastolic pressure of 13 mmHg  PA pressure 46/17 with a mean PA pressure of 29 mmHg            Mean wedge pressure of 13 mmHg with V waves to 20 mmHg            PVR of 2.3 Woods units            PA pulsatility index of 2.2 3.  Capacious iliofemoral vessels bilaterally.   Recommendation: Continue evaluation for aortic valve intervention.   Indications   Nonrheumatic aortic valve stenosis [I35.0 (ICD-10-CM)]    Procedural Details   Technical Details The patient is a 89 year old male with severe symptomatic aortic stenosis, type 2 diabetes mellitus, CKD stage II and a right bundle branch block seen in the outpatient setting due to lifestyle limiting dyspnea.  He is referred for right heart catheterization, coronary angiography study, and peripheral angiography study as part of an evaluation for an aortic  valve intervention.  After obtaining consent, a previous placed antecubital IV was exchanged for 5 [place] Terumo glide sheath. Xylocaine  is used to anesthetize the right wrist and a 6 French Terumo glide sheath was placed.  5000 units heparin  and 5 mg verapamil  were administered through the sheath.  A 6 [place] TIG catheter was used for selective angiography of the right system and a 6 [place] XB LAD 3.5 guiding catheter was used for angiography of the left system.  5 French pigtail catheter was used for peripheral angiography; it was maneuvered to the distal abdominal aorta and abdominal aortography and bilateral iliofemoral angiography was performed.  After review of the angiographic images and hemodynamic data, no further interventions were pursued.  A TR band was placed and manual pressure applied to the antecubital site. Estimated blood loss <50 mL.   During this procedure medications were administered to achieve and maintain moderate conscious sedation while the patient's heart rate, blood pressure, and oxygen saturation were continuously monitored and I was present face-to-face 100% of this time. Jacinto Martins Rad Tech and Algie Antis RN  Alean Hunt Cardiovascular Specialist are independent, trained observers who assisted in the monitoring of the patient's level of consciousness.    Medications (Filter: Administrations occurring from 1113 to 1222 on 03/14/24) 0.9 %  sodium chloride  infusion (mL)  Total volume: 250 mL Date/Time Rate/Dose/Volume Action    03/14/24 1126 250 mL - 10 mL/hr New Bag/Given    fentaNYL  (SUBLIMAZE ) injection (mcg)  Total dose: 25 mcg Date/Time Rate/Dose/Volume Action    03/14/24 1135 25 mcg Given    midazolam  (VERSED ) injection (mg)  Total dose: 1 mg Date/Time Rate/Dose/Volume Action    03/14/24 1135 1 mg Given    Heparin  (Porcine) in NaCl 1000-0.9 UT/500ML-% SOLN (mL)  Total volume: 1,000 mL Date/Time Rate/Dose/Volume Action    03/14/24 1135 1,000 mL  Given    lidocaine  (PF) (XYLOCAINE ) 1 % injection (mL)  Total volume: 5 mL Date/Time Rate/Dose/Volume Action    03/14/24 1143 5 mL Given    Radial Cocktail/Verapamil  only (mL)  Total volume: 10 mL Date/Time Rate/Dose/Volume Action    03/14/24 1145 10 mL Given    heparin  sodium (porcine) injection (Units)  Total dose: 5,000 Units Date/Time Rate/Dose/Volume Action    03/14/24 1145 5,000 Units Given    iohexol  (OMNIPAQUE ) 350 MG/ML injection (mL)  Total volume: 60 mL Date/Time Rate/Dose/Volume Action    03/14/24 1218 60 mL Given      Sedation Time   Sedation Time Physician-1: 36 minutes 53 seconds Contrast  Administrations occurring from 1113 to 1222 on 03/14/24:  Medication Name Total Dose  iohexol  (OMNIPAQUE ) 350 MG/ML injection 60 mL    Radiation/Fluoro   Fluoro time: 6.3 (min) DAP: 13.8 (Gycm2) Cumulative Air Kerma: 201 (mGy) Complications   Complications documented before study signed (03/14/2024 12:22 PM)    No complications were associated with this study.  Documented by Alinda Apley, RCIS - 03/14/2024 12:10 PM      Coronary Findings   Diagnostic Dominance: Right Left Anterior Descending  The vessel exhibits minimal luminal irregularities.  Mid LAD lesion is 40% stenosed.    Left Circumflex  The vessel exhibits minimal luminal irregularities.    Right Coronary Artery  The vessel exhibits minimal luminal irregularities.    Intervention    No interventions have been documented.    Coronary Diagrams   Diagnostic Dominance: Right  Intervention    Implants    No implant documentation for this case.    Syngo Images    Show images for CARDIAC CATHETERIZATION Images on Long Term Storage    Show images for Dixon, Luczak to Procedure Log   Procedure Log    Hemodynamics   Pressures Phases Resting  Right      RA Mean  mmHg 13    RA A-Wave  mmHg 17    RA V-Wave  mmHg 16  Pulmonary      PA  mmHg 46/17 (29)    PCW Mean   mmHg 13.0    PCW A-Wave  mmHg 20.0    PCW V-Wave  mmHg 16.0    PAPi   2.2    Saturations Phases Resting    PA  % 71    Arterial  % 96    Hemo Data   Flowsheet Row Most Recent Value  Fick Cardiac Output 7.02 L/min  Fick Cardiac Output Index 3.96 (L/min)/BSA  RA A Wave 17 mmHg  RA V Wave 16 mmHg  RA Mean 13 mmHg  RV Systolic Pressure 45 mmHg  RV Diastolic Pressure 8 mmHg  RV EDP 19 mmHg  PA Systolic Pressure 46 mmHg  PA Diastolic Pressure 17 mmHg  PA Mean 29 mmHg  PW A Wave 20 mmHg  PW V Wave 16 mmHg  PW Mean 13 mmHg  AO Systolic Pressure 141 mmHg  AO Diastolic Pressure 71 mmHg  AO Mean 97 mmHg  QP/QS 0.76  TPVR Index 9.64 HRUI  TSVR Index 19.7 HRUI  PVR SVR Ratio 0.32  TPVR/TSVR Ratio 0.49    ADDENDUM REPORT: 04/16/2024 17:44   EXAM: OVER-READ INTERPRETATION  CT CHEST   The following report is an over-read performed by radiologist Dr. Chadwick Colonel of [facility] on 04/16/2024. This over-read does not include interpretation of cardiac or coronary anatomy or pathology. The coronary CTA interpretation by the cardiologist is attached.   COMPARISON:  Concurrent full field of view chest CTA, reported separately.   FINDINGS: Aortic atherosclerosis. No acute findings in the included portion of the thorax. Please reference same day full field of view chest CT for detailed assessment.   IMPRESSION: Aortic Atherosclerosis (ICD10-I70.0).     Electronically Signed   By: Chadwick Colonel M.D.   On: 04/16/2024 17:44    Addended by Katrina Parma, MD on 04/16/2024  5:47 PM    Study Result   Narrative & Impression  CLINICAL DATA:  60M with severe aortic stenosis being evaluated for a TAVR procedure.   EXAM: Cardiac TAVR CT   TECHNIQUE:  A non-contrast, gated CT scan was obtained with axial slices of 2.5 mm through the heart for aortic valve scoring. A 120 kV retrospective, gated, contrast cardiac scan was obtained. Gantry rotation speed was  230 msec and collimation was 0.63 mm. Nitroglycerin was not given. A delayed scan was obtained to exclude left atrial appendage thrombus. The 3D dataset was reconstructed in systole with motion correction. The 3D data set was reconstructed in 5% intervals of the 0-95% of the R-R cycle. Systolic and diastolic phases were analyzed on a dedicated workstation using MPR, MIP, and VRT modes. The patient received 100 cc of contrast.   FINDINGS: Aortic Root:   Aortic valve: trileaflet   Aortic valve calcium score: 2021   Aortic annulus:   Diameter: 26mm x 21mm   Perimeter: 74mm   Area: 422 mm^2   Calcifications: No calcifications   Coronary height: Min Left - 13mm; Min Right - 13mm   Sinotubular height: Left cusp - 23mm; Right cusp - 21mm; Noncoronary cusp - 22mm   LVOT (as measured 3 mm below the annulus):   Diameter: 28mm x 21mm   Area: 420mm^2   Calcifications: No calcifications   Aortic sinus width: Left cusp - 36mm; Right cusp - 33mm; Noncoronary cusp - 32mm   Sinotubular junction width: 27mm x 26mm   Optimum Fluoroscopic Angle for Delivery: LAO 2 CRA 3   Cardiac:   Right atrium: Normal size   Right ventricle: Normal size   Pulmonary arteries: Normal size   Pulmonary veins: Normal configuration   Left atrium: Normal size   Left ventricle: Normal size   Pericardium: Normal thickness   Coronary arteries: Normal origins. Coronary calcium score 454 (percentile not available for age>84)   IMPRESSION: 1. Tricuspid aortic valve with severe calcifications (AV calcium score 2021)   2. Aortic annulus measures 26mm x 21mm in diameter with perimeter 74mm and area 422 mm^2. No annular or LVOT calcifications. Annular measurements are suitable for delivery of 23mm Edwards Sapien 3 valve   3. Sufficient coronary to annulus distance   4. Optimum Fluoroscopic Angle for Delivery:  LAO 2 CRA 3   5. Coronary calcium score 454 (percentile not available for  age>84)   Electronically Signed: By: Carson Clara M.D. On: 04/02/2024 21:05        Narrative & Impression  CLINICAL DATA:  Aortic valve replacement, preoperative evaluation   EXAM: CTA ABDOMEN AND PELVIS WITHOUT AND WITH CONTRAST   TECHNIQUE: Multidetector CT imaging of the abdomen and pelvis was performed using the standard protocol during bolus administration of intravenous contrast. Multiplanar reconstructed images and MIPs were obtained and reviewed to evaluate the vascular anatomy.   RADIATION DOSE REDUCTION: This exam was performed according to the departmental dose-optimization program which includes automated exposure control, adjustment of the mA and/or kV according to patient size and/or use of iterative reconstruction technique.   CONTRAST:  OMNIPAQUE  IOHEXOL  350 MG/ML SOLN   COMPARISON:  None Available.   FINDINGS: VASCULAR   Aorta: The abdominal aorta is patent with mild atherosclerotic changes. Minimal diameter measures 13 mm.   Celiac: Patent with mild atherosclerotic changes in the proximal segment.   SMA: Patent with mild atherosclerotic changes in the proximal segment.   Renals: Multiple renal arteries on the right, patent. The left main renal artery is also patent.   IMA: Patent.   Inflow: Right common iliac artery is patent with mild atherosclerotic changes. Minimal diameter measures 9 mm. The right internal iliac artery is  patent. The right external iliac artery is tortuous with minimal diameter estimated at 9 mm.   The left common iliac artery is patent with mild atherosclerotic changes. Minimal diameter is 9 mm. Left internal iliac artery is patent. The left external iliac artery is tortuous and patent with minimal diameter measuring 7 mm.   Proximal Outflow: The right and left common femoral arteries are patent. Minimal diameters measure 8 mm.   Veins: No obvious venous abnormality within the limitations of  this arterial phase study.   Review of the MIP images confirms the above findings.   NON-VASCULAR   Lower chest: Reported separately.   Hepatobiliary: Tiny gallstone in the dependent portion.   Pancreas: Nothing significant.   Spleen: Normal in size without focal abnormality.   Adrenals/Urinary Tract: Adrenal glands are unremarkable. Kidneys are normal, without renal calculi, focal lesion, or hydronephrosis. Bladder is unremarkable.   Stomach/Bowel: Nothing significant.   Lymphatic: Nothing significant.   Reproductive: Nothing significant.   Other: Nothing significant.   Musculoskeletal: No acute or significant osseous findings.   IMPRESSION: 1. No access issues identified.  Minimal diameter is detailed above.     Electronically Signed   By: Reagan Camera M.D.   On: 04/03/2024 07:53          Impression:   This 89 year old active gentleman has stage D, severe, symptomatic aortic stenosis with NYHA class II symptoms of exertional fatigue and shortness of breath consistent with chronic diastolic congestive heart failure.  His echocardiogram was done at an outside institution but reportedly showed a mean gradient of 41 mmHg across the aortic valve with a valve area of 0.82 cm consistent with severe aortic stenosis.  I have personally reviewed his cardiac catheterization and CTA studies.  Cardiac catheterization showed mild to moderate nonobstructive coronary disease.  I agree that aortic valve replacement is indicated in this patient for relief of his symptoms and to prevent left ventricular dysfunction.  His gated cardiac CTA shows anatomy suitable for TAVR using a 23 mm SAPIEN 3 valve.  His abdominal and pelvic CTA shows adequate pelvic vascular anatomy to allow transfemoral insertion.  His preoperative ECG shows sinus rhythm with right bundle branch block.   The patient and his wife were counseled at length regarding treatment alternatives for management of severe  symptomatic aortic stenosis. The risks and benefits of surgical intervention has been discussed in detail. Long-term prognosis with medical therapy was discussed. Alternative approaches such as conventional surgical aortic valve replacement, transcatheter aortic valve replacement, and palliative medical therapy were compared and contrasted at length. This discussion was placed in the context of the patient's own specific clinical presentation and past medical history. All of their questions have been addressed.    Following the decision to proceed with transcatheter aortic valve replacement, a discussion was held regarding what types of management strategies would be attempted intraoperatively in the event of life-threatening complications, including whether or not the patient would be considered a candidate for the use of cardiopulmonary bypass and/or conversion to open sternotomy for attempted surgical intervention.  He is 89 years old but still very active and looks younger than his stated age.  I think he would be a candidate for emergent sternotomy to manage any intraoperative complications.  The patient has been advised of a variety of complications that might develop including but not limited to risks of death, stroke, paravalvular leak, aortic dissection or other major vascular complications, aortic annulus rupture, device embolization, cardiac rupture or perforation, mitral  regurgitation, acute myocardial infarction, arrhythmia, heart block or bradycardia requiring permanent pacemaker placement, congestive heart failure, respiratory failure, renal failure, pneumonia, infection, other late complications related to structural valve deterioration or migration, or other complications that might ultimately cause a temporary or permanent loss of functional independence or other long term morbidity. The patient provides full informed consent for the procedure as described and all questions were answered.      Plan:   Transfemoral TAVR using a SAPIEN 3 valve.  We will plan to use a right internal jugular temporary pacemaker for the procedure.     Bartley Lightning, MD

## 2024-05-21 ENCOUNTER — Encounter (HOSPITAL_COMMUNITY): Payer: Self-pay | Admitting: Certified Registered"

## 2024-05-21 ENCOUNTER — Other Ambulatory Visit: Payer: Self-pay

## 2024-05-21 ENCOUNTER — Inpatient Hospital Stay (HOSPITAL_COMMUNITY): Admitting: Anesthesiology

## 2024-05-21 ENCOUNTER — Inpatient Hospital Stay (HOSPITAL_COMMUNITY)
Admission: RE | Admit: 2024-05-21 | Discharge: 2024-05-22 | DRG: 267 | Disposition: A | Discharge status: Home / Self Care | Source: Ambulatory Visit | Attending: Internal Medicine | Admitting: Internal Medicine

## 2024-05-21 ENCOUNTER — Inpatient Hospital Stay (HOSPITAL_COMMUNITY): Admit: 2024-05-21 | Admitting: Internal Medicine

## 2024-05-21 ENCOUNTER — Inpatient Hospital Stay (HOSPITAL_COMMUNITY)

## 2024-05-21 ENCOUNTER — Encounter (HOSPITAL_COMMUNITY)
Admission: RE | Disposition: A | Discharge status: Home / Self Care | Payer: Self-pay | Source: Ambulatory Visit | Attending: Internal Medicine

## 2024-05-21 ENCOUNTER — Encounter (HOSPITAL_COMMUNITY): Payer: Self-pay | Admitting: Internal Medicine

## 2024-05-21 DIAGNOSIS — E119 Type 2 diabetes mellitus without complications: Secondary | ICD-10-CM | POA: Diagnosis not present

## 2024-05-21 DIAGNOSIS — E041 Nontoxic single thyroid nodule: Secondary | ICD-10-CM | POA: Diagnosis present

## 2024-05-21 DIAGNOSIS — Z006 Encounter for examination for normal comparison and control in clinical research program: Secondary | ICD-10-CM | POA: Diagnosis not present

## 2024-05-21 DIAGNOSIS — Z882 Allergy status to sulfonamides status: Secondary | ICD-10-CM | POA: Diagnosis not present

## 2024-05-21 DIAGNOSIS — Z7984 Long term (current) use of oral hypoglycemic drugs: Secondary | ICD-10-CM | POA: Diagnosis not present

## 2024-05-21 DIAGNOSIS — Z888 Allergy status to other drugs, medicaments and biological substances status: Secondary | ICD-10-CM | POA: Diagnosis not present

## 2024-05-21 DIAGNOSIS — I350 Nonrheumatic aortic (valve) stenosis: Secondary | ICD-10-CM

## 2024-05-21 DIAGNOSIS — E1122 Type 2 diabetes mellitus with diabetic chronic kidney disease: Secondary | ICD-10-CM | POA: Diagnosis present

## 2024-05-21 DIAGNOSIS — I4510 Unspecified right bundle-branch block: Secondary | ICD-10-CM | POA: Diagnosis present

## 2024-05-21 DIAGNOSIS — Z885 Allergy status to narcotic agent status: Secondary | ICD-10-CM

## 2024-05-21 DIAGNOSIS — Z952 Presence of prosthetic heart valve: Secondary | ICD-10-CM

## 2024-05-21 DIAGNOSIS — Z91041 Radiographic dye allergy status: Secondary | ICD-10-CM

## 2024-05-21 DIAGNOSIS — R5383 Other fatigue: Secondary | ICD-10-CM | POA: Diagnosis present

## 2024-05-21 DIAGNOSIS — R0602 Shortness of breath: Secondary | ICD-10-CM | POA: Diagnosis present

## 2024-05-21 DIAGNOSIS — Z7982 Long term (current) use of aspirin: Secondary | ICD-10-CM

## 2024-05-21 DIAGNOSIS — M1990 Unspecified osteoarthritis, unspecified site: Secondary | ICD-10-CM

## 2024-05-21 DIAGNOSIS — N182 Chronic kidney disease, stage 2 (mild): Secondary | ICD-10-CM | POA: Diagnosis present

## 2024-05-21 HISTORY — DX: Unspecified osteoarthritis, unspecified site: M19.90

## 2024-05-21 HISTORY — DX: Unspecified right bundle-branch block: I45.10

## 2024-05-21 HISTORY — DX: Nonrheumatic aortic (valve) stenosis: I35.0

## 2024-05-21 HISTORY — DX: Pneumonia, unspecified organism: J18.9

## 2024-05-21 HISTORY — PX: INTRAOPERATIVE TRANSTHORACIC ECHOCARDIOGRAM: SHX6523

## 2024-05-21 LAB — POCT I-STAT, CHEM 8
BUN: 11 mg/dL (ref 8–23)
Calcium, Ion: 1.21 mmol/L (ref 1.15–1.40)
Chloride: 100 mmol/L (ref 98–111)
Creatinine, Ser: 0.9 mg/dL (ref 0.61–1.24)
Glucose, Bld: 159 mg/dL — ABNORMAL HIGH (ref 70–99)
HCT: 34 % — ABNORMAL LOW (ref 39.0–52.0)
Hemoglobin: 11.6 g/dL — ABNORMAL LOW (ref 13.0–17.0)
Potassium: 4.3 mmol/L (ref 3.5–5.1)
Sodium: 139 mmol/L (ref 135–145)
TCO2: 25 mmol/L (ref 22–32)

## 2024-05-21 LAB — GLUCOSE, CAPILLARY
Glucose-Capillary: 151 mg/dL — ABNORMAL HIGH (ref 70–99)
Glucose-Capillary: 158 mg/dL — ABNORMAL HIGH (ref 70–99)
Glucose-Capillary: 143 mg/dL — ABNORMAL HIGH (ref 70–99)
Glucose-Capillary: 154 mg/dL — ABNORMAL HIGH (ref 70–99)
Glucose-Capillary: 216 mg/dL — ABNORMAL HIGH (ref 70–99)

## 2024-05-21 LAB — ECHOCARDIOGRAM LIMITED
AR max vel: 2.73 cm2
AV Area VTI: 2.93 cm2
AV Area mean vel: 2.63 cm2
AV Mean grad: 5 mmHg
AV Peak grad: 8.5 mmHg
Ao pk vel: 1.46 m/s

## 2024-05-21 LAB — ABO/RH: ABO/RH(D): O NEG

## 2024-05-21 SURGERY — TRANSCATHETER AORTIC VALVE REPLACEMENT, TRANSFEMORAL (CATHLAB)
Anesthesia: Monitor Anesthesia Care | Laterality: Right

## 2024-05-21 MED ORDER — NITROGLYCERIN IN D5W 200-5 MCG/ML-% IV SOLN: 0.0000 ug/min | INTRAVENOUS | Status: DC

## 2024-05-21 MED ORDER — SODIUM CHLORIDE 0.9 % IV SOLN: 250.0000 mL | INTRAVENOUS | Status: DC | PRN

## 2024-05-21 MED ORDER — LACTATED RINGERS IV SOLN
INTRAVENOUS | Status: DC | PRN
Start: 2024-05-21 — End: 2024-05-21

## 2024-05-21 MED ORDER — LIDOCAINE HCL (PF) 1 % IJ SOLN
INTRAMUSCULAR | Status: DC | PRN
  Administered 2024-05-21 (×2): 12 mL
  Administered 2024-05-21: 5 mL

## 2024-05-21 MED ORDER — CEFAZOLIN SODIUM-DEXTROSE 2-4 GM/100ML-% IV SOLN
2.0000 g | Freq: Three times a day (TID) | INTRAVENOUS | Status: AC
  Administered 2024-05-21 – 2024-05-22 (×2): 2 g via INTRAVENOUS
  Filled 2024-05-21 (×2): qty 100

## 2024-05-21 MED ORDER — POTASSIUM CHLORIDE 2 MEQ/ML IV SOLN
80.0000 meq | INTRAVENOUS | Status: AC
  Filled 2024-05-21: qty 40

## 2024-05-21 MED ORDER — IODIXANOL 320 MG/ML IV SOLN
INTRAVENOUS | Status: DC | PRN
  Administered 2024-05-21: 75 mL via INTRA_ARTERIAL

## 2024-05-21 MED ORDER — INSULIN ASPART 100 UNIT/ML IJ SOLN
0.0000 [IU] | Freq: Three times a day (TID) | INTRAMUSCULAR | Status: DC
  Administered 2024-05-21: 2 [IU] via SUBCUTANEOUS

## 2024-05-21 MED ORDER — MAGNESIUM SULFATE 50 % IJ SOLN
40.0000 meq | INTRAMUSCULAR | Status: AC
  Filled 2024-05-21: qty 9.85

## 2024-05-21 MED ORDER — ONDANSETRON HCL 4 MG/2ML IJ SOLN: 4.0000 mg | Freq: Four times a day (QID) | INTRAMUSCULAR | Status: DC | PRN

## 2024-05-21 MED ORDER — DEXMEDETOMIDINE HCL IN NACL 400 MCG/100ML IV SOLN
0.1000 ug/kg/h | INTRAVENOUS | Status: AC
  Administered 2024-05-21: 1 ug/kg/h via INTRAVENOUS
  Administered 2024-05-21: 34.24 ug via INTRAVENOUS
  Filled 2024-05-21: qty 100

## 2024-05-21 MED ORDER — FENTANYL CITRATE (PF) 100 MCG/2ML IJ SOLN
INTRAMUSCULAR | Status: DC | PRN
  Administered 2024-05-21 (×2): 25 ug via INTRAVENOUS

## 2024-05-21 MED ORDER — CEFAZOLIN SODIUM-DEXTROSE 2-4 GM/100ML-% IV SOLN
2.0000 g | INTRAVENOUS | Status: AC
  Administered 2024-05-21: 2 g via INTRAVENOUS
  Filled 2024-05-21: qty 100

## 2024-05-21 MED ORDER — HEPARIN (PORCINE) IN NACL 1000-0.9 UT/500ML-% IV SOLN
INTRAVENOUS | Status: DC | PRN
  Administered 2024-05-21 (×3): 500 mL

## 2024-05-21 MED ORDER — INSULIN ASPART 100 UNIT/ML IJ SOLN: 0.0000 [IU] | INTRAMUSCULAR | Status: DC | PRN

## 2024-05-21 MED ORDER — SODIUM CHLORIDE 0.9% FLUSH: 3.0000 mL | INTRAVENOUS | Status: DC | PRN

## 2024-05-21 MED ORDER — CHLORHEXIDINE GLUCONATE 4 % EX SOLN: 60.0000 mL | Freq: Once | CUTANEOUS | Status: DC

## 2024-05-21 MED ORDER — SODIUM CHLORIDE 0.9% FLUSH
3.0000 mL | Freq: Two times a day (BID) | INTRAVENOUS | Status: DC
  Administered 2024-05-21 – 2024-05-22 (×2): 3 mL via INTRAVENOUS

## 2024-05-21 MED ORDER — ACETAMINOPHEN 325 MG PO TABS: 650.0000 mg | ORAL_TABLET | Freq: Four times a day (QID) | ORAL | Status: DC | PRN

## 2024-05-21 MED ORDER — ACETAMINOPHEN 500 MG PO TABS
1000.0000 mg | ORAL_TABLET | Freq: Once | ORAL | Status: AC
  Administered 2024-05-21: 1000 mg via ORAL
  Filled 2024-05-21: qty 2

## 2024-05-21 MED ORDER — NOREPINEPHRINE 4 MG/250ML-% IV SOLN
0.0000 ug/min | INTRAVENOUS | Status: AC
  Administered 2024-05-21: 2 ug/min via INTRAVENOUS
  Filled 2024-05-21: qty 250

## 2024-05-21 MED ORDER — NOREPINEPHRINE 4 MG/250ML-% IV SOLN: 0.0000 ug/min | INTRAVENOUS | Status: DC

## 2024-05-21 MED ORDER — CHLORHEXIDINE GLUCONATE 4 % EX SOLN: 30.0000 mL | CUTANEOUS | Status: DC

## 2024-05-21 MED ORDER — TRAMADOL HCL 50 MG PO TABS: 50.0000 mg | ORAL_TABLET | ORAL | Status: DC | PRN

## 2024-05-21 MED ORDER — HEPARIN SODIUM (PORCINE) 1000 UNIT/ML IJ SOLN
INTRAMUSCULAR | Status: DC | PRN
  Administered 2024-05-21: 10000 [IU] via INTRAVENOUS

## 2024-05-21 MED ORDER — OXYCODONE HCL 5 MG PO TABS: 5.0000 mg | ORAL_TABLET | ORAL | Status: DC | PRN

## 2024-05-21 MED ORDER — ACETAMINOPHEN 650 MG RE SUPP: 650.0000 mg | Freq: Four times a day (QID) | RECTAL | Status: DC | PRN

## 2024-05-21 MED ORDER — LIDOCAINE HCL (PF) 1 % IJ SOLN
INTRAMUSCULAR | Status: AC
  Filled 2024-05-21: qty 30

## 2024-05-21 MED ORDER — HEPARIN 30,000 UNITS/1000 ML (OHS) CELLSAVER SOLUTION
Status: AC
  Filled 2024-05-21: qty 1000

## 2024-05-21 MED ORDER — SODIUM CHLORIDE 0.9 % IV SOLN: INTRAVENOUS | Status: DC

## 2024-05-21 MED ORDER — MORPHINE SULFATE (PF) 2 MG/ML IV SOLN: 1.0000 mg | INTRAVENOUS | Status: DC | PRN

## 2024-05-21 MED ORDER — PROTAMINE SULFATE 10 MG/ML IV SOLN
INTRAVENOUS | Status: DC | PRN
Start: 2024-05-21 — End: 2024-05-21
  Administered 2024-05-21: 100 mg via INTRAVENOUS

## 2024-05-21 MED ORDER — SODIUM CHLORIDE 0.9 % IV SOLN: INTRAVENOUS | Status: AC

## 2024-05-21 MED ORDER — FENTANYL CITRATE (PF) 100 MCG/2ML IJ SOLN
INTRAMUSCULAR | Status: AC
Start: 2024-05-21 — End: 2024-05-21
  Filled 2024-05-21: qty 2

## 2024-05-21 MED ORDER — CHLORHEXIDINE GLUCONATE 0.12 % MT SOLN
15.0000 mL | Freq: Once | OROMUCOSAL | Status: AC
  Administered 2024-05-21: 15 mL via OROMUCOSAL
  Filled 2024-05-21: qty 15

## 2024-05-21 SURGICAL SUPPLY — 31 items
BAG SNAP BAND KOVER 36X36 (MISCELLANEOUS) ×2 IMPLANT
CABLE ADAPT PACING TEMP 12FT (ADAPTER) IMPLANT
CATH 23 ULTRA DELIVERY (CATHETERS) IMPLANT
CATH DIAG 6FR PIGTAIL ANGLED (CATHETERS) IMPLANT
CATH INFINITI 5FR ANG PIGTAIL (CATHETERS) IMPLANT
CATH INFINITI 6F AL1 (CATHETERS) IMPLANT
CLOSURE PERCLOSE PROSTYLE (VASCULAR PRODUCTS) IMPLANT
CRIMPER (MISCELLANEOUS) IMPLANT
DEVICE INFLATION ATRION QL2530 (MISCELLANEOUS) IMPLANT
DEVICE INFLATION ATRION QL38 (MISCELLANEOUS) IMPLANT
ELECT DEFIB PAD ADLT CADENCE (PAD) IMPLANT
KIT SAPIAN 3 ULTRA RESILIA 23 (Valve) IMPLANT
PACK CARDIAC CATHETERIZATION (CUSTOM PROCEDURE TRAY) ×1 IMPLANT
SET ATX-X65L (MISCELLANEOUS) IMPLANT
SHEATH INTRODUCER SET 20-26 (SHEATH) IMPLANT
SHEATH PINNACLE 6F 10CM (SHEATH) IMPLANT
SHEATH PINNACLE 7F 10CM (SHEATH) IMPLANT
SHEATH PINNACLE 8F 10CM (SHEATH) IMPLANT
SHEATH PROBE COVER 6X72 (BAG) IMPLANT
SHIELD CATH-GARD CONTAMINATION (MISCELLANEOUS) IMPLANT
STOPCOCK MORSE 400PSI 3WAY (MISCELLANEOUS) ×2 IMPLANT
TRANSDUCER W/STOPCOCK (MISCELLANEOUS) IMPLANT
TUBING ART PRESS 72 MALE/FEM (TUBING) IMPLANT
TUBING CIL FLEX 10 FLL-RA (TUBING) IMPLANT
WIRE AMPLATZ SS-J .035X180CM (WIRE) IMPLANT
WIRE EMERALD 3MM-J .035X150CM (WIRE) IMPLANT
WIRE EMERALD 3MM-J .035X260CM (WIRE) IMPLANT
WIRE EMERALD ST .035X260CM (WIRE) IMPLANT
WIRE MICRO SET SILHO 5FR 7 (SHEATH) IMPLANT
WIRE PACING TEMP ST TIP 5 (CATHETERS) IMPLANT
WIRE SAFARI SM CURVE 275 (WIRE) IMPLANT

## 2024-05-21 NOTE — Discharge Instructions (Signed)

## 2024-05-21 NOTE — Interval H&P Note (Signed)
 History and Physical Interval Note:  05/21/2024 9:23 AM  James Avery  has presented today for surgery, with the diagnosis of Severe Aortic Stenosis.  The various methods of treatment have been discussed with the patient and family. After consideration of risks, benefits and other options for treatment, the patient has consented to  Procedure(s): Transcatheter Aortic Valve Replacement, Transfemoral (Right) ECHOCARDIOGRAM, TRANSTHORACIC (N/A) as a surgical intervention.  The patient's history has been reviewed, patient examined, no change in status, stable for surgery.  I have reviewed the patient's chart and labs.  Questions were answered to the patient's satisfaction.     Gwendloyn Forsee K Cindy Brindisi

## 2024-05-21 NOTE — Anesthesia Postprocedure Evaluation (Signed)
 Anesthesia Post Note  Patient: James Avery  Procedure(s) Performed: Transcatheter Aortic Valve Replacement, Transfemoral (Right) ECHOCARDIOGRAM, TRANSTHORACIC     Patient location during evaluation: Cath Lab Anesthesia Type: MAC Level of consciousness: awake and alert, oriented and patient cooperative Pain management: pain level controlled Vital Signs Assessment: post-procedure vital signs reviewed and stable Respiratory status: spontaneous breathing, nonlabored ventilation and respiratory function stable Cardiovascular status: stable and blood pressure returned to baseline Postop Assessment: no apparent nausea or vomiting Anesthetic complications: no  There were no known notable events for this encounter.  Last Vitals:  Vitals:   05/21/24 1351 05/21/24 1400  BP: (!) 134/59 (!) 140/64  Pulse: (!) 57 (!) 54  Resp: 13 18  Temp:    SpO2: 96% 100%    Last Pain:  Vitals:   05/21/24 1400  TempSrc:   PainSc: 0-No pain                 Janeya Deyo,E. Olisa Quesnel

## 2024-05-21 NOTE — Discharge Summary (Addendum)
 [facility]   MULTIDISCIPLINARY HEART VALVE TEAM  Discharge Summary    Patient ID: James Avery MRN: [id number]; DOB: [birth year]  Admit date: 05/21/2024 Discharge date: 05/22/2024  PCP:  No primary care provider on file.  [facility] Cardiologist:  James Phy, MD  [facility] Structural heart: James Phy, MD [facility] Electrophysiologist:  None   Discharge Diagnoses    Principal Problem:   S/P TAVR (transcatheter aortic valve replacement) Active Problems:   Diabetes mellitus without complication (HCC)   Severe aortic stenosis   Arthritis   RBBB   Allergies Allergies  Allergen Reactions   Demerol [Meperidine Hcl]     Does not remember reaction    Fluorescein  Nausea And Vomiting    Retinal dye injection    Pravastatin Sodium     Does not remember reaction    Septra [Sulfamethoxazole-Trimethoprim]     Does not remember reaction     Diagnostic Studies/Procedures    [facility]  TAVR OPERATIVE NOTE     Date of Procedure:                05/21/2024   Preoperative Diagnosis:      Severe Aortic Stenosis    Postoperative Diagnosis:    Same    Procedure:        Transcatheter Aortic Valve Replacement - Transfemoral Approach             Edwards Sapien 3 Resilia THV (size 23 mm, model # 9755RLS, serial # [id number])              Co-Surgeons:                         James Lightning, MD and James Backbone, MD Anesthesiologist:                  James Avery   Echocardiographer:              James Avery   Pre-operative Echo Findings: Severe aortic stenosis Normal left ventricular systolic function   Post-operative Echo Findings: No paravalvular leak Normal left ventricular systolic function   Left Heart Catheterization Findings: Left ventricular end-diastolic pressure of 16  mmHg _____________    Echo 05/22/24: completed but pending formal read at the time of discharge   History of Present Illness     James Avery is a  89 y.o. male with a history of RBBB, CKD stage II, DMT2, and severe AS who presented to [facility] on 05/21/24 for planned TAVR.   He presented with a history of gradually increasing fatigue over the past couple years and then had an episode of syncope over [place] weekend last year. He was walking up some stairs and blacked out falling down the stairs. Outside echo 03/01/24 showed EF 60% and severe AS with a mean grad 41 mmHg & AVA 0.83 cm2. [facility] 03/14/24 showed showed mild to moderate nonobstructive coronary disease.   The patient was evaluated by the multidisciplinary valve team and felt to have severe, symptomatic aortic stenosis and to be a suitable candidate for TAVR, which was set up for 05/21/24.   Hospital Course     Consultants: none   Severe AS:  -- S/p successful TAVR with a 23 mm Edwards Sapien 3 Ultra Resilia THV via the TF approach on 05/21/24.  -- Post operative echo completed but pending formal read. -- Groin sites are stable.  -- Continue Asprin  81mg  daily.  -- Met with cardiac rehab to discuss CRP phase II.  -- Plan for discharge home today with close follow up in the outpatient setting.   RBBB: -- Pre existing RBBB.  -- New LAFB after TAVR but no HAVB. -- DC with Zio AT given elevated risk for HAVB after TAVR.  DMT2:  -- Treated with SSI while admitted.  -- Resume home meds at discharge.  -- Okay to resume Metformin after 48 hours after contrast dye exposure (6/19 PM)   Thyroid  nodule: --  A 6 mm nodule is present in the left thyroid  on pre TAVR CT. -- Consider dedicated thyroid  ultrasound for definitive characterization.  -- This will be discussed in the outpatient setting.  _____________  Discharge Vitals Blood pressure (!) 133/52, pulse 96, temperature 98.3 F (36.8 C), temperature source Oral, resp. rate 20, height 5' 4 (1.626 m), weight 70.6 kg, SpO2 95%.  Filed Weights   05/21/24 0707 05/22/24 0400  Weight: 68.5 kg 70.6 kg     GEN: Well nourished, well  developed in no acute distress NECK: No JVD CARDIAC: RRR, soft flow murmurs @ RUSB. No rubs, gallops RESPIRATORY:  Clear to auscultation without rales, wheezing or rhonchi  ABDOMEN: Soft, non-tender, non-distended EXTREMITIES:  No edema; No deformity.  Groin sites clear without hematoma or ecchymosis.    Disposition   Pt is being discharged home today in good condition.  Follow-up Plans & Appointments     Follow-up Information     Ardia Kraft, PA-C. Go on 05/27/2024.   Specialties: Cardiology, Radiology Why: @ 11:10am, please arrive at least 10 minutes early. Contact information: [address] [place] [address] [phone]                  Discharge Medications   Allergies as of 05/22/2024       Reactions   Demerol [meperidine Hcl]    Does not remember reaction    Fluorescein  Nausea And Vomiting   Retinal dye injection    Pravastatin Sodium    Does not remember reaction    Septra [sulfamethoxazole-trimethoprim]    Does not remember reaction         Medication List     PAUSE taking these medications    metFORMIN 500 MG 24 hr tablet Wait to take this until: May 23, 2024 Evening Commonly known as: GLUCOPHAGE-XR Take 1,000 mg by mouth 2 (two) times daily with a meal.       TAKE these medications    aspirin  EC 81 MG tablet Take 1 tablet (81 mg total) by mouth daily. Swallow whole. What changed: when to take this   cyanocobalamin 1000 MCG tablet Commonly known as: VITAMIN B12 Take 1,000 mcg by mouth in the morning.   ezetimibe 10 MG tablet Commonly known as: ZETIA Take 10 mg by mouth in the morning.   multivitamin with minerals tablet Take 1 tablet by mouth in the morning.   trimethoprim-polymyxin b ophthalmic solution Commonly known as: POLYTRIM Place 1 drop into the left eye See admin instructions. Receives eye injection about every 5-6 weeks, uses eye drops after procedure for that day            Outstanding  Labs/Studies   none _____________________  Duration of Discharge Encounter: APP Time: 15 minutes    Signed, Abagail Hoar, PA-C 05/22/2024, 9:43 AM [phone]   ATTENDING ATTESTATION:  After conducting a review of all available clinical information with the care team, interviewing the patient,  and performing a physical exam, I agree with the findings and plan described in this note.   GEN: No acute distress.   HEENT:  MMM, no JVD, no scleral icterus Cardiac: RRR, no murmurs, rubs, or gallops.  Respiratory: Clear to auscultation bilaterally. GI: Soft, nontender, non-distended  MS: No edema; No deformity. Neuro:  Nonfocal  Vasc:  +2 radial pulses  Patient doing well after uncomplicated TAVR with a 23 mm SAPIEN 3 valve from the right transfemoral approach.  Following the procedure the patient did develop new bifascicular block.  He will be discharged with a ZIO monitor.  His groin sites are stable and he has had no signs or symptoms of stroke.  Will discharge today on aspirin  81 mg after echocardiogram with close hospital follow-up.   APP discharge time:15 MD discharge time:20  James Backbone, MD Pager [phone]

## 2024-05-21 NOTE — Anesthesia Preprocedure Evaluation (Signed)
 Anesthesia Evaluation  Patient identified by MRN, date of birth, ID band Patient awake    Reviewed: Allergy & Precautions, NPO status , Patient's Chart, lab work & pertinent test results  History of Anesthesia Complications Negative for: history of anesthetic complications  Airway Mallampati: II  TM Distance: >3 FB Neck ROM: Full    Dental  (+) Missing, Dental Advisory Given   Pulmonary neg pulmonary ROS   breath sounds clear to auscultation       Cardiovascular + Valvular Problems/Murmurs AS  Rhythm:Regular Rate:Normal + Systolic murmurs 03/980 Cath:   Mid LAD lesion is 40% stenosed.   1.  Mild to moderate nonobstructive coronary artery disease of right dominant system. 2.  Fick cardiac output of 7.0 L/min and Fick cardiac index of 4.0 L/min/m with the following hemodynamics:            Right atrial pressure mean of 13 mmHg            Right ventricular pressure 48/8 with an end-diastolic pressure of 13 mmHg            PA pressure 46/17 with a mean PA pressure of 29 mmHg            Mean wedge pressure of 13 mmHg with V waves to 20 mmHg            PVR of 2.3 Woods units            PA pulsatility index of 2.2  04/2024 ECHO:  mean gradient of 41 mmHg with a valve area of 0.82 cm.  Left ventricular ejection fraction was 60 to 65%.       Neuro/Psych Near syncope with AS    GI/Hepatic negative GI ROS, Neg liver ROS,,,  Endo/Other  diabetes (glu 158), Oral Hypoglycemic Agents    Renal/GU Renal InsufficiencyRenal disease     Musculoskeletal  (+) Arthritis ,    Abdominal   Peds  Hematology Hb 12.9, plt 343k   Anesthesia Other Findings   Reproductive/Obstetrics                             Anesthesia Physical Anesthesia Plan  ASA: 3  Anesthesia Plan: MAC   Post-op Pain Management: Tylenol  PO (pre-op)*   Induction: Intravenous  PONV Risk Score and Plan: 1 and Treatment may vary due to  age or medical condition  Airway Management Planned: Natural Airway and Simple Face Mask  Additional Equipment: None  Intra-op Plan:   Post-operative Plan:   Informed Consent: I have reviewed the patients History and Physical, chart, labs and discussed the procedure including the risks, benefits and alternatives for the proposed anesthesia with the patient or authorized representative who has indicated his/her understanding and acceptance.     Dental advisory given  Plan Discussed with: CRNA and Surgeon  Anesthesia Plan Comments:        Anesthesia Quick Evaluation

## 2024-05-21 NOTE — Op Note (Signed)
 [facility]   MULTIDISCIPLINARY HEART VALVE TEAM   TAVR OPERATIVE NOTE   Date of Procedure:  05/21/2024  Preoperative Diagnosis: Severe Aortic Stenosis   Postoperative Diagnosis: Same   Procedure:   Transcatheter Aortic Valve Replacement - Percutaneous Right Transfemoral Approach  Edwards Sapien 3 Ultra Resilia THV (size 23 mm, model # 9755RSL, serial # [id number])   Co-Surgeons:  Bartley Lightning, MD and Alyssa Backbone, MD   Anesthesiologist:  Fay Hoop, MD   Echocardiographer:              Sharrie Deed, MD  Pre-operative Echo Findings: Severe aortic stenosis Normal left ventricular systolic function  Post-operative Echo Findings: No paravalvular leak Normal left ventricular systolic function   BRIEF CLINICAL NOTE AND INDICATIONS FOR SURGERY  This 89 year old active gentleman has stage D, severe, symptomatic aortic stenosis with NYHA class II symptoms of exertional fatigue and shortness of breath consistent with chronic diastolic congestive heart failure.  His echocardiogram was done at an outside institution but reportedly showed a mean gradient of 41 mmHg across the aortic valve with a valve area of 0.82 cm consistent with severe aortic stenosis.  I have personally reviewed his cardiac catheterization and CTA studies.  Cardiac catheterization showed mild to moderate nonobstructive coronary disease.  I agree that aortic valve replacement is indicated in this patient for relief of his symptoms and to prevent left ventricular dysfunction.  His gated cardiac CTA shows anatomy suitable for TAVR using a 23 mm SAPIEN 3 valve.  His abdominal and pelvic CTA shows adequate pelvic vascular anatomy to allow transfemoral insertion.  His preoperative ECG shows sinus rhythm with right bundle branch block.   The patient and his wife were counseled at length regarding treatment alternatives for management of severe symptomatic aortic stenosis. The risks and benefits of surgical  intervention has been discussed in detail. Long-term prognosis with medical therapy was discussed. Alternative approaches such as conventional surgical aortic valve replacement, transcatheter aortic valve replacement, and palliative medical therapy were compared and contrasted at length. This discussion was placed in the context of the patient's own specific clinical presentation and past medical history. All of their questions have been addressed.    Following the decision to proceed with transcatheter aortic valve replacement, a discussion was held regarding what types of management strategies would be attempted intraoperatively in the event of life-threatening complications, including whether or not the patient would be considered a candidate for the use of cardiopulmonary bypass and/or conversion to open sternotomy for attempted surgical intervention.  He is 89 years old but still very active and looks younger than his stated age.  I think he would be a candidate for emergent sternotomy to manage any intraoperative complications.  The patient has been advised of a variety of complications that might develop including but not limited to risks of death, stroke, paravalvular leak, aortic dissection or other major vascular complications, aortic annulus rupture, device embolization, cardiac rupture or perforation, mitral regurgitation, acute myocardial infarction, arrhythmia, heart block or bradycardia requiring permanent pacemaker placement, congestive heart failure, respiratory failure, renal failure, pneumonia, infection, other late complications related to structural valve deterioration or migration, or other complications that might ultimately cause a temporary or permanent loss of functional independence or other long term morbidity. The patient provides full informed consent for the procedure as described and all questions were answered.       DETAILS OF THE OPERATIVE PROCEDURE  PREPARATION:    The  patient was brought to  the operating room on the above mentioned date and appropriate monitoring was established by the anesthesia team. The patient was placed in the supine position on the operating table.  Intravenous antibiotics were administered. The patient was monitored closely throughout the procedure under conscious sedation.    Baseline transthoracic echocardiogram was performed. The patient's abdomen and both groins were prepped and draped in a sterile manner. A time out procedure was performed.   PERIPHERAL ACCESS:    Using the modified Seldinger technique, femoral arterial access was obtained with placement of a 6 Fr sheath on the left side. A 6 Fr sheath was placed in the right internal jugular vein in a similar manner.  A pigtail diagnostic catheter was passed through the left arterial sheath under fluoroscopic guidance into the aortic root.  A temporary transvenous pacemaker catheter was passed through the right internal jugular sheath under fluoroscopic guidance into the right ventricle.  The pacemaker was tested to ensure stable lead placement and pacemaker capture. Aortic root angiography was performed in order to determine the optimal angiographic angle for valve deployment.   TRANSFEMORAL ACCESS:   Percutaneous transfemoral access and sheath placement was performed using ultrasound guidance.  The right common femoral artery was cannulated using a micropuncture needle and appropriate location was verified using hand injection angiogram.  A pair of Abbott Perclose percutaneous closure devices were placed and a 6 French sheath replaced into the femoral artery.  The patient was heparinized systemically and ACT verified > 250 seconds.    A 14 Fr transfemoral E-sheath was introduced into the right common femoral artery after progressively dilating over an Amplatz superstiff wire. An AL-1 catheter was used to direct a straight-tip exchange length wire across the native aortic valve into  the left ventricle. This was exchanged out for a pigtail catheter and position was confirmed in the LV apex. Simultaneous LV and Ao pressures were recorded.  The pigtail catheter was exchanged for a Safari wire in the LV apex.   BALLOON AORTIC VALVULOPLASTY:   Not performed   TRANSCATHETER HEART VALVE DEPLOYMENT:   An Edwards Sapien 3 Ultra transcatheter heart valve (size 23 mm) was prepared and crimped per manufacturer's guidelines, and the proper orientation of the valve is confirmed on the [employer] delivery system. The valve was advanced through the introducer sheath using normal technique until in an appropriate position in the abdominal aorta beyond the sheath tip. The balloon was then retracted and using the fine-tuning wheel was centered on the valve. The valve was then advanced across the aortic arch using appropriate flexion of the catheter. The valve was carefully positioned across the aortic valve annulus. The Commander catheter was retracted using normal technique. Once final position of the valve has been confirmed by angiographic assessment, the valve is deployed during rapid ventricular pacing to maintain systolic blood pressure < 50 mmHg and pulse pressure < 10 mmHg. The balloon inflation is held for >3 seconds after reaching full deployment volume. Once the balloon has fully deflated the balloon is retracted into the ascending aorta and valve function is assessed using echocardiography. There is felt to be no paravalvular leak and no central aortic insufficiency.  The patient's hemodynamic recovery following valve deployment is good.  The deployment balloon and guidewire are both removed.    PROCEDURE COMPLETION:   The sheath was removed and femoral artery closure performed.  Protamine was administered once femoral arterial repair was complete. The temporary pacemaker, pigtail catheter and femoral sheath were removed.  A  Perclose femoral closure device was utilized following  removal of the diagnostic sheath in the left femoral artery.  The patient tolerated the procedure well and is transported to the cath lab recovery area in stable condition. There were no immediate intraoperative complications. All sponge instrument and needle counts are verified correct at completion of the operation.   No blood products were administered during the operation.  The patient received a total of 75 mL of intravenous contrast during the procedure.   Bartley Lightning, MD 05/21/2024

## 2024-05-21 NOTE — Interval H&P Note (Signed)
 History and Physical Interval Note:  05/21/2024 7:11 AM  James Avery  has presented today for surgery, with the diagnosis of Severe Aortic Stenosis.  The various methods of treatment have been discussed with the patient and family. After consideration of risks, benefits and other options for treatment, the patient has consented to  Procedure(s): Transcatheter Aortic Valve Replacement, Transfemoral (Right) ECHOCARDIOGRAM, TRANSTHORACIC (N/A) as a surgical intervention.  The patient's history has been reviewed, patient examined, no change in status, stable for surgery.  I have reviewed the patient's chart and labs.  Questions were answered to the patient's satisfaction.     Kijuan Gallicchio K Adilynn Bessey

## 2024-05-21 NOTE — Progress Notes (Signed)
 Pt arrived from ...cath.., A/ox 4...pt denies any pain, MD aware,CCMD called. CHG bath given,no further needs at this time

## 2024-05-21 NOTE — Op Note (Signed)
 HEART AND VASCULAR CENTER  TAVR OPERATIVE NOTE   Date of Procedure:  05/21/2024  Preoperative Diagnosis: Severe Aortic Stenosis   Postoperative Diagnosis: Same   Procedure:   Transcatheter Aortic Valve Replacement - Transfemoral Approach  Edwards Sapien 3 Resilia THV (size 23 mm, model # 9755RLS, serial # [id number])   Co-Surgeons:   Bartley Lightning, MD and Alyssa Backbone, MD Anesthesiologist:  Cleora Daft  Echocardiographer:  Stann Earnest  Pre-operative Echo Findings: Severe aortic stenosis Normal left ventricular systolic function  Post-operative Echo Findings: No paravalvular leak Normal left ventricular systolic function  Left Heart Catheterization Findings: Left ventricular end-diastolic pressure of 16 mmHg   BRIEF CLINICAL NOTE AND INDICATIONS FOR SURGERY  The patient is an 89 year old male with a history of severe symptomatic aortic stenosis, type 2 diabetes, CKD stage II, and right bundle branch block who was referred for elective transcatheter aortic valve replacement with a 23 mm SAPIEN 3 valve from the right transfemoral approach.  During the course of the patient's preoperative work up they have been evaluated comprehensively by a multidisciplinary team of specialists coordinated through the Multidisciplinary Heart Valve Clinic in the [facility].  They have been demonstrated to suffer from symptomatic severe aortic stenosis as noted above. The patient has been counseled extensively as to the relative risks and benefits of all options for the treatment of severe aortic stenosis including long term medical therapy, conventional surgery for aortic valve replacement, and transcatheter aortic valve replacement.  The patient has been independently evaluated by Dr. Sherene Dilling with CT surgery and they are felt to be at high risk for conventional surgical aortic valve replacement. The surgeon indicated the patient would be a poor candidate for conventional surgery.  Based upon review of all of the patient's preoperative diagnostic tests they are felt to be candidate for transcatheter aortic valve replacement using the transfemoral approach as an alternative to high risk conventional surgery.    Following the decision to proceed with transcatheter aortic valve replacement, a discussion has been held regarding what types of management strategies would be attempted intraoperatively in the event of life-threatening complications, including whether or not the patient would be considered a candidate for the use of cardiopulmonary bypass and/or conversion to open sternotomy for attempted surgical intervention.  The patient has been advised of a variety of complications that might develop peculiar to this approach including but not limited to risks of death, stroke, paravalvular leak, aortic dissection or other major vascular complications, aortic annulus rupture, device embolization, cardiac rupture or perforation, acute myocardial infarction, arrhythmia, heart block or bradycardia requiring permanent pacemaker placement, congestive heart failure, respiratory failure, renal failure, pneumonia, infection, other late complications related to structural valve deterioration or migration, or other complications that might ultimately cause a temporary or permanent loss of functional independence or other long term morbidity.  The patient provides full informed consent for the procedure as described and all questions were answered preoperatively.    DETAILS OF THE OPERATIVE PROCEDURE  PREPARATION:   The patient is brought to the operating room on the above mentioned date and central monitoring was established by the anesthesia team. The patient is placed in the supine position on the operating table.  Intravenous antibiotics are administered. Conscious sedation is used.   Baseline transthoracic echocardiogram was performed. The patient's chest, abdomen, both groins, and both lower  extremities are prepared and draped in a sterile manner. A time out procedure is performed.   PERIPHERAL ACCESS:   Using the  modified Seldinger technique, femoral arterial and venous access were obtained with placement of a 6 Fr sheath in the left common femoral artery and a 6 Fr sheath in the right internal jugular vein using u/s guidance.  A pigtail diagnostic catheter was passed through the femoral arterial sheath under fluoroscopic guidance into the aortic root.  A temporary transvenous pacemaker catheter was passed through the internal jugular venous sheath under fluoroscopic guidance into the right ventricle.  The pacemaker was tested to ensure stable lead placement and pacemaker capture. Aortic root angiography was performed in order to determine the optimal angiographic angle for valve deployment.  TRANSFEMORAL ACCESS:  A micropuncture kit was used to gain access to the right common femoral artery using u/s guidance. Position confirmed with angiography. Pre-closure with double ProGlide closure devices. The patient was heparinized systemically and ACT verified > 250 seconds.    A 14 Fr transfemoral E-sheath was introduced into the right common femoral artery after progressively dilating over an Amplatz superstiff wire. An AL-1 catheter was used to direct a straight-tip exchange length wire across the native aortic valve into the left ventricle. This was exchanged out for a pigtail catheter and position was confirmed in the LV apex. Simultaneous left ventricular, aortic, and left ventricular end-diastolic pressures were recorded.    TRANSCATHETER HEART VALVE DEPLOYMENT:  An Edwards Sapien 3 THV (size 23 mm) was prepared and crimped per manufacturer's guidelines, and the proper orientation of the valve is confirmed on the [employer] delivery system. The valve was advanced through the introducer sheath using normal technique until in an appropriate position in the abdominal aorta beyond the  sheath tip. The balloon was then retracted and using the fine-tuning wheel was centered on the valve. The valve was then advanced across the aortic arch using appropriate flexion of the catheter. The valve was carefully positioned across the aortic valve annulus. The Commander catheter was retracted using normal technique. Once final position of the valve has been confirmed by angiographic assessment, the valve is deployed while temporarily holding ventilation and during rapid ventricular pacing to maintain systolic blood pressure < 50 mmHg and pulse pressure < 10 mmHg. The balloon inflation is held for >3 seconds after reaching full deployment volume. Once the balloon has fully deflated the balloon is retracted into the ascending aorta and valve function is assessed using TTE. There is felt to be no paravalvular leak and no central aortic insufficiency.  The patient's hemodynamic recovery following valve deployment is good.  The deployment balloon and guidewire are both removed. Echo demostrated acceptable post-procedural gradients, stable mitral valve function, and no AI.   PROCEDURE COMPLETION:  The sheath was then removed and closure devices were completed. Protamine was administered once femoral arterial repair was complete. The temporary pacemaker, pigtail catheters and femoral sheaths were removed with  a Perclose closure device placed in the artery and manual pressure used for venous hemostasis.    The patient tolerated the procedure well and is transported to the surgical intensive care in stable condition. There were no immediate intraoperative complications. All sponge instrument and needle counts are verified correct at completion of the operation.   No blood products were administered during the operation.  The patient received a total of 75 mL of intravenous contrast during the procedure.  Michayla Mcneil K Kamori Barbier MD 05/21/2024 11:23 AM

## 2024-05-21 NOTE — Progress Notes (Signed)
  HEART AND VASCULAR CENTER   MULTIDISCIPLINARY HEART VALVE TEAM  Patient doing well s/p TAVR. He/She is hemodynamically stable. Groin sites stable. ECG with sinus brady with old RBBB and no high grade block. Transferred from cath lab holding to 4E. Early ambulation after bedrest completed and hopeful discharge over the next 24-48 hours.   Abagail Hoar PA-C  [facility]  Pager [phone]

## 2024-05-21 NOTE — Transfer of Care (Signed)
 Immediate Anesthesia Transfer of Care Note  Patient: James Avery  Procedure(s) Performed: Transcatheter Aortic Valve Replacement, Transfemoral (Right) ECHOCARDIOGRAM, TRANSTHORACIC  Patient Location: PACU and Cath Lab  Anesthesia Type:MAC  Level of Consciousness: awake and sedated  Airway & Oxygen Therapy: Patient Spontanous Breathing and Patient connected to nasal cannula oxygen  Post-op Assessment: Report given to RN and Post -op Vital signs reviewed and stable  Post vital signs: Reviewed and stable  Last Vitals:  Vitals Value Taken Time  BP 113/69 05/21/24 11:15  Temp    Pulse 65 05/21/24 11:19  Resp 19 05/21/24 11:19  SpO2 92 % 05/21/24 11:19  Vitals shown include unfiled device data.  Last Pain:  Vitals:   05/21/24 0755  TempSrc:   PainSc: 0-No pain      Patients Stated Pain Goal: 0 (05/21/24 0729)  Complications: There were no known notable events for this encounter.

## 2024-05-21 NOTE — Plan of Care (Signed)

## 2024-05-22 ENCOUNTER — Inpatient Hospital Stay (HOSPITAL_COMMUNITY)
Admit: 2024-05-22 | Discharge: 2024-05-22 | Disposition: A | Discharge status: Home / Self Care | Attending: Physician Assistant | Admitting: Physician Assistant

## 2024-05-22 ENCOUNTER — Encounter (HOSPITAL_COMMUNITY): Payer: Self-pay | Admitting: Internal Medicine

## 2024-05-22 ENCOUNTER — Inpatient Hospital Stay (HOSPITAL_COMMUNITY)

## 2024-05-22 DIAGNOSIS — Z952 Presence of prosthetic heart valve: Secondary | ICD-10-CM

## 2024-05-22 DIAGNOSIS — I4510 Unspecified right bundle-branch block: Secondary | ICD-10-CM

## 2024-05-22 LAB — CBC
HCT: 37.2 % — ABNORMAL LOW (ref 39.0–52.0)
Hemoglobin: 12.5 g/dL — ABNORMAL LOW (ref 13.0–17.0)
MCH: 29.3 pg (ref 26.0–34.0)
MCHC: 33.6 g/dL (ref 30.0–36.0)
MCV: 87.3 fL (ref 80.0–100.0)
Platelets: 303 10*3/uL (ref 150–400)
RBC: 4.26 MIL/uL (ref 4.22–5.81)
RDW: 13.5 % (ref 11.5–15.5)
WBC: 12.2 10*3/uL — ABNORMAL HIGH (ref 4.0–10.5)
nRBC: 0 % (ref 0.0–0.2)

## 2024-05-22 LAB — ECHOCARDIOGRAM COMPLETE
AR max vel: 1.72 cm2
AV Area VTI: 1.73 cm2
AV Area mean vel: 1.87 cm2
AV Mean grad: 12 mmHg
AV Peak grad: 25 mmHg
Ao pk vel: 2.5 m/s
Area-P 1/2: 2.81 cm2
Calc EF: 63.1 %
Est EF: >75
Height: 64 in
S' Lateral: 2.6 cm
Single Plane A2C EF: 67.4 %
Single Plane A4C EF: 59.5 %
Weight: 2491.2 [oz_av]

## 2024-05-22 LAB — BASIC METABOLIC PANEL WITH GFR
Anion gap: 10 (ref 5–15)
BUN: 10 mg/dL (ref 8–23)
CO2: 25 mmol/L (ref 22–32)
Calcium: 8.6 mg/dL — ABNORMAL LOW (ref 8.9–10.3)
Chloride: 100 mmol/L (ref 98–111)
Creatinine, Ser: 0.97 mg/dL (ref 0.61–1.24)
GFR, Estimated: >60 mL/min (ref >60)
Glucose, Bld: 188 mg/dL — ABNORMAL HIGH (ref 70–99)
Potassium: 3.9 mmol/L (ref 3.5–5.1)
Sodium: 135 mmol/L (ref 135–145)

## 2024-05-22 LAB — GLUCOSE, CAPILLARY
Glucose-Capillary: 163 mg/dL — ABNORMAL HIGH (ref 70–99)
Glucose-Capillary: 161 mg/dL — ABNORMAL HIGH (ref 70–99)

## 2024-05-22 LAB — POCT I-STAT, CHEM 8
BUN: 10 mg/dL (ref 8–23)
Calcium, Ion: 1.17 mmol/L (ref 1.15–1.40)
Chloride: 103 mmol/L (ref 98–111)
Creatinine, Ser: 0.8 mg/dL (ref 0.61–1.24)
Glucose, Bld: 169 mg/dL — ABNORMAL HIGH (ref 70–99)
HCT: 33 % — ABNORMAL LOW (ref 39.0–52.0)
Hemoglobin: 11.2 g/dL — ABNORMAL LOW (ref 13.0–17.0)
Potassium: 4.1 mmol/L (ref 3.5–5.1)
Sodium: 139 mmol/L (ref 135–145)
TCO2: 25 mmol/L (ref 22–32)

## 2024-05-22 LAB — MAGNESIUM: Magnesium: 1.8 mg/dL (ref 1.7–2.4)

## 2024-05-22 MED ORDER — EZETIMIBE 10 MG PO TABS
10.0000 mg | ORAL_TABLET | Freq: Every day | ORAL | Status: DC
  Administered 2024-05-22: 10 mg via ORAL
  Filled 2024-05-22: qty 1

## 2024-05-22 MED ORDER — ASPIRIN 81 MG PO TBEC
81.0000 mg | DELAYED_RELEASE_TABLET | Freq: Every day | ORAL | Status: DC
  Administered 2024-05-22: 81 mg via ORAL
  Filled 2024-05-22: qty 1

## 2024-05-22 NOTE — Progress Notes (Addendum)
 Patient sitting in chair heart rate up to 120 S.T. stood to void Heart rate 125 S.T. Back to back S.R. 90's to low 100's S.T. Reden RN aware

## 2024-05-22 NOTE — Progress Notes (Signed)
 Patient refusing to take Insulin. He does not take it at home and does not want it.

## 2024-05-22 NOTE — Progress Notes (Signed)
 CARDIAC REHAB PHASE I   Post TAVR education including site care, restrictions, risk factors, heart healthy diet, exercise guidelines and CRP2 reviewed. All questions and concerns addressed. Will send referral for CRP2 to [facility]. Plan for discharge home today.   [id number] Ronny Colas, RN BSN 05/22/2024 11:00 AM

## 2024-05-22 NOTE — Progress Notes (Signed)
 ZIO AT applied at hospital. Dr. Albert Huff to read.

## 2024-05-22 NOTE — Progress Notes (Signed)
 Patient refused insulin. Patient states he does not take it at home. Patient has orders to discharge home with wife.

## 2024-05-22 NOTE — Progress Notes (Signed)
 patient given discharge paper work, all questions answered

## 2024-05-23 ENCOUNTER — Encounter (INDEPENDENT_AMBULATORY_CARE_PROVIDER_SITE_OTHER): Admitting: Ophthalmology

## 2024-05-23 ENCOUNTER — Telehealth: Payer: Self-pay | Admitting: Physician Assistant

## 2024-05-23 NOTE — Telephone Encounter (Signed)
  HEART AND VASCULAR CENTER   MULTIDISCIPLINARY HEART VALVE TEAM   Patient contacted regarding discharge from [facility] on 6/18  Patient understands to follow up with a structural heart provider on 6/23 at [address] [facility]. Patient understands discharge instructions? yes Patient understands medications and regimen? yes Patient understands to bring all medications to this visit? yes  Abagail Hoar PA-C  [facility]

## 2024-05-27 ENCOUNTER — Ambulatory Visit: Attending: Physician Assistant | Admitting: Physician Assistant

## 2024-05-27 ENCOUNTER — Encounter: Payer: Self-pay | Admitting: Physician Assistant

## 2024-05-27 VITALS — BP 144/66 | HR 62 | Ht 64.0 in | Wt 157.2 lb

## 2024-05-27 DIAGNOSIS — I4510 Unspecified right bundle-branch block: Secondary | ICD-10-CM | POA: Diagnosis not present

## 2024-05-27 DIAGNOSIS — I10 Essential (primary) hypertension: Secondary | ICD-10-CM

## 2024-05-27 DIAGNOSIS — R42 Dizziness and giddiness: Secondary | ICD-10-CM

## 2024-05-27 DIAGNOSIS — Z952 Presence of prosthetic heart valve: Secondary | ICD-10-CM | POA: Diagnosis not present

## 2024-05-27 DIAGNOSIS — E041 Nontoxic single thyroid nodule: Secondary | ICD-10-CM

## 2024-05-27 MED ORDER — AMOXICILLIN 500 MG PO TABS: 2000.0000 mg | ORAL_TABLET | ORAL | 12 refills | Status: AC

## 2024-05-27 MED ORDER — LISINOPRIL 5 MG PO TABS: 5.0000 mg | ORAL_TABLET | Freq: Every day | ORAL | 3 refills | Status: DC

## 2024-05-27 NOTE — Progress Notes (Signed)
 HEART AND VASCULAR CENTER   MULTIDISCIPLINARY HEART VALVE CLINIC                                     Cardiology Office Note:    Date:  05/27/2024   ID:  James Avery, DOB 1933/12/21, MRN [id number]  PCP:  No primary care provider on file.  [facility] Cardiologist:  Arun K Thukkani, MD  [facility] Structural heart: Lurena MARLA Red, MD [facility] Electrophysiologist:  None   Referring MD: Magdaline Debby HERO, MD   [facility] s/p TAVR  History of Present Illness:    James Avery is a 89 y.o. male with a hx of  RBBB, CKD stage II, DMT2, and severe AS s/p TAVR (05/21/24) who presents to clinic for follow up.   He presented with a history of gradually increasing fatigue over the past couple years and then had an episode of syncope over [place] weekend last year. He was walking up some stairs and blacked out falling down the stairs. Outside echo 03/01/24 showed EF 60% and severe AS with a mean grad 41 mmHg & AVA 0.83 cm2. [facility] 03/14/24 showed showed mild to moderate nonobstructive coronary disease. S/p successful TAVR with a 23 mm Edwards Sapien 3 Ultra Resilia THV via the TF approach on 05/21/24. Post operative echo showed EF >75%, normally functioning TAVR with a mean gradient of 12 mmHg and no PVL. Given underlying RBBB and new LAFB, he was discharged with a Zio AT.   Today the patient presents to clinic for follow up. No CP or SOB. No LE edema, orthopnea or PND. He had one episode of profound dizziness but syncope. Yesterday, they were driving back from church and he had sudden onset of the room spinning. No loss of vision, weakness or aphasia. It lasted about 15 minutes but really scared him. He has not had recurrence. No blood in stool or urine. No palpitations. He did talk about being awake and uncomfortable throughout his surgery and wanted us  to know.    Past Medical History:  Diagnosis Date   Arthritis    Diabetes mellitus without complication (HCC)    Pneumonia    RBBB    S/P  TAVR (transcatheter aortic valve replacement) 05/21/2024   s/p TAVR with a 23 mm Edwards S3UR via the TF approach by Dr. Red and Dr Lucas   Severe aortic stenosis      Current Medications: Current Meds  Medication Sig   amoxicillin (AMOXIL) 500 MG tablet Take 4 tablets (2,000 mg total) by mouth as directed. 1 hour prior to dental work including cleanings   aspirin  EC 81 MG tablet Take 1 tablet (81 mg total) by mouth daily. Swallow whole. (Patient taking differently: Take 81 mg by mouth in the morning. Swallow whole.)   cyanocobalamin (VITAMIN B12) 1000 MCG tablet Take 1,000 mcg by mouth in the morning.   ezetimibe  (ZETIA ) 10 MG tablet Take 10 mg by mouth in the morning.   lisinopril (ZESTRIL) 5 MG tablet Take 1 tablet (5 mg total) by mouth daily.   metFORMIN (GLUCOPHAGE-XR) 500 MG 24 hr tablet Take 1,000 mg by mouth 2 (two) times daily with a meal.   Multiple Vitamins-Minerals (MULTIVITAMIN WITH MINERALS) tablet Take 1 tablet by mouth in the morning.   trimethoprim-polymyxin b (POLYTRIM) ophthalmic solution Place 1 drop into the left eye See admin instructions. Receives eye injection about every  5-6 weeks, uses eye drops after procedure for that day      ROS:   Please see the history of present illness.    All other systems reviewed and are negative.  EKGs   EKG Interpretation Date/Time:  Monday May 27 2024 11:08:57 EDT Ventricular Rate:  62 PR Interval:  160 QRS Duration:  134 QT Interval:  408 QTC Calculation: 414 R Axis:   -34  Text Interpretation: Normal sinus rhythm with sinus arrhythmia Left axis deviation Right bundle branch block When compared with ECG of 22-May-2024 04:58, Vent. rate has decreased BY  35 BPM Left anterior fascicular block is no longer Present QT has shortened Confirmed by Sebastian Collar [phone]) on 05/27/2024 11:30:33 AM   Risk Assessment/Calculations:           Physical Exam:    VS:  BP (!) 144/66 (BP Location: Left Arm, Patient  Position: Sitting, Cuff Size: Normal)   Pulse 62   Ht 5' 4 (1.626 m)   Wt 157 lb 3.2 oz (71.3 kg)   SpO2 96%   BMI 26.98 kg/m     Wt Readings from Last 3 Encounters:  05/27/24 157 lb 3.2 oz (71.3 kg)  05/22/24 155 lb 11.2 oz (70.6 kg)  05/08/24 151 lb (68.5 kg)     GEN: Well nourished, well developed in no acute distress NECK: No JVD CARDIAC: RRR, 1/6 flow murmur @ RUSB. No rubs, gallops RESPIRATORY:  Clear to auscultation without rales, wheezing or rhonchi  ABDOMEN: Soft, non-tender, non-distended EXTREMITIES:  No edema; No deformity.  Groin sites clear without hematoma. Mild ecchymosis noted bilaterally.   ASSESSMENT:    1. S/P TAVR (transcatheter aortic valve replacement)   2. RBBB   3. Thyroid  nodule   4. Essential hypertension   5. Dizziness, nonspecific     PLAN:    In order of problems listed above:  Severe AS s/p TAVR:  -- Pt doing well s/p TAVR.  -- Groin sites healing well.  -- SBE prophylaxis discussed; I have RX'd amoxicillin.  -- Continue Asprin 81mg  daily.  -- Highly encouraged CRP phase II - he wants to participate in [place]. -- Cleared to resume all activities without restriction. -- I will see back for 1 month echo and OV.   RBBB: -- Pre existing RBBB and new LAFB after TAVR. -- DC'd with Zio AT given elevated risk for HAVB after TAVR. -- ECG with no HAVB today and no high risk alerts on Zio patch.   Thyroid  nodule: -- A 6 mm thyroid  nodule was noted on pre TAVR CT. -- This was discussed today and he would like to discuss with PCP to see if any further work up is necessary. (Report printed for him to take to visit).   HTN:  -- This is a new diagnosis for him.  -- BP was 150/75 on my personal recheck today.  -- Start Lisinpril 5mg  daily and get a BMET in 2 weeks.   Dizziness: -- Had an isolated episode of the room spinning that lasted ~15 seconds. -- Zio showed NSR during the episode. -- I think this sounds vestibular in nature.  --  Continue to monitor.    Medication Adjustments/Labs and Tests Ordered: Current medicines are reviewed at length with the patient today.  Concerns regarding medicines are outlined above.  Orders Placed This Encounter  Procedures   Basic Metabolic Panel (BMET)   EKG 12-Lead   Meds ordered this encounter  Medications   amoxicillin (AMOXIL) 500  MG tablet    Sig: Take 4 tablets (2,000 mg total) by mouth as directed. 1 hour prior to dental work including cleanings    Dispense:  12 tablet    Refill:  12    Supervising Provider:   COOPER, MICHAEL [[id number]]   lisinopril (ZESTRIL) 5 MG tablet    Sig: Take 1 tablet (5 mg total) by mouth daily.    Dispense:  90 tablet    Refill:  3    Patient Instructions  Medication Instructions:  Your physician has recommended you make the following change in your medication: Start Amoxicillin 500 mg, take 4 tablets by mouth 1 hour prior to dental procedures and cleanings.    Start Lisinopril 5mg  to take daily.  *If you need a refill on your cardiac medications before your next appointment, please call your pharmacy*  Lab Work: BMP in 2 weeks If you have labs (blood work) drawn today and your tests are completely normal, you will receive your results only by: MyChart Message (if you have MyChart) OR A paper copy in the mail If you have any lab test that is abnormal or we need to change your treatment, we will call you to review the results.  Testing/Procedures: None  Follow-Up: At [facility], you and your health needs are our priority.  As part of our continuing mission to provide you with exceptional heart care, our providers are all part of one team.  This team includes your primary Cardiologist (physician) and Advanced Practice Providers or APPs (Physician Assistants and Nurse Practitioners) who all work together to provide you with the care you need, when you need it.  Your next appointment:   Keep as scheduled: 07/08/24 at  11:35am  Provider:   Izetta Hummer, PA-C    We recommend signing up for the patient portal called MyChart.  Sign up information is provided on this After Visit Summary.  MyChart is used to connect with patients for Virtual Visits (Telemedicine).  Patients are able to view lab/test results, encounter notes, upcoming appointments, etc.  Non-urgent messages can be sent to your provider as well.   To learn more about what you can do with MyChart, go to [email or web address].         Signed, Lamarr Hummer, PA-C  05/27/2024 12:35 PM    [facility]

## 2024-05-27 NOTE — Patient Instructions (Signed)
 Medication Instructions:  Your physician has recommended you make the following change in your medication: Start Amoxicillin 500 mg, take 4 tablets by mouth 1 hour prior to dental procedures and cleanings.    Start Lisinopril 5mg  to take daily.  *If you need a refill on your cardiac medications before your next appointment, please call your pharmacy*  Lab Work: BMP in 2 weeks If you have labs (blood work) drawn today and your tests are completely normal, you will receive your results only by: MyChart Message (if you have MyChart) OR A paper copy in the mail If you have any lab test that is abnormal or we need to change your treatment, we will call you to review the results.  Testing/Procedures: None  Follow-Up: At [facility], you and your health needs are our priority.  As part of our continuing mission to provide you with exceptional heart care, our providers are all part of one team.  This team includes your primary Cardiologist (physician) and Advanced Practice Providers or APPs (Physician Assistants and Nurse Practitioners) who all work together to provide you with the care you need, when you need it.  Your next appointment:   Keep as scheduled: 07/08/24 at 11:35am  Provider:   Izetta Hummer, PA-C    We recommend signing up for the patient portal called MyChart.  Sign up information is provided on this After Visit Summary.  MyChart is used to connect with patients for Virtual Visits (Telemedicine).  Patients are able to view lab/test results, encounter notes, upcoming appointments, etc.  Non-urgent messages can be sent to your provider as well.   To learn more about what you can do with MyChart, go to [email or web address].

## 2024-05-30 ENCOUNTER — Encounter (INDEPENDENT_AMBULATORY_CARE_PROVIDER_SITE_OTHER): Admitting: Ophthalmology

## 2024-05-30 DIAGNOSIS — H34812 Central retinal vein occlusion, left eye, with macular edema: Secondary | ICD-10-CM

## 2024-05-30 DIAGNOSIS — I10 Essential (primary) hypertension: Secondary | ICD-10-CM | POA: Diagnosis not present

## 2024-05-30 DIAGNOSIS — H43813 Vitreous degeneration, bilateral: Secondary | ICD-10-CM

## 2024-05-30 DIAGNOSIS — Z7984 Long term (current) use of oral hypoglycemic drugs: Secondary | ICD-10-CM

## 2024-05-30 DIAGNOSIS — H35033 Hypertensive retinopathy, bilateral: Secondary | ICD-10-CM | POA: Diagnosis not present

## 2024-05-30 DIAGNOSIS — E113391 Type 2 diabetes mellitus with moderate nonproliferative diabetic retinopathy without macular edema, right eye: Secondary | ICD-10-CM

## 2024-06-03 ENCOUNTER — Telehealth (HOSPITAL_COMMUNITY): Payer: Self-pay

## 2024-06-03 NOTE — Telephone Encounter (Signed)
Per phase I cardiac rehab, fax referral to [place].

## 2024-06-12 ENCOUNTER — Other Ambulatory Visit: Payer: Self-pay

## 2024-06-12 DIAGNOSIS — Z952 Presence of prosthetic heart valve: Secondary | ICD-10-CM

## 2024-06-13 ENCOUNTER — Ambulatory Visit: Payer: Self-pay | Admitting: Physician Assistant

## 2024-06-13 LAB — BASIC METABOLIC PANEL WITH GFR
BUN/Creatinine Ratio: 16 (ref 10–24)
BUN: 16 mg/dL (ref 8–27)
CO2: 21 mmol/L (ref 20–29)
Calcium: 9.5 mg/dL (ref 8.6–10.2)
Chloride: 101 mmol/L (ref 96–106)
Creatinine, Ser: 1 mg/dL (ref 0.76–1.27)
Glucose: 88 mg/dL (ref 70–99)
Potassium: 5 mmol/L (ref 3.5–5.2)
Sodium: 137 mmol/L (ref 134–144)
eGFR: 73 mL/min/1.73 (ref >59)

## 2024-06-14 ENCOUNTER — Ambulatory Visit: Payer: Self-pay | Admitting: Internal Medicine

## 2024-06-14 DIAGNOSIS — I4510 Unspecified right bundle-branch block: Secondary | ICD-10-CM

## 2024-06-14 DIAGNOSIS — Z952 Presence of prosthetic heart valve: Secondary | ICD-10-CM | POA: Diagnosis not present

## 2024-06-14 NOTE — Addendum Note (Signed)
 Encounter addended by: Sharicka Pogorzelski N on: 06/14/2024 4:51 PM  Actions taken: Imaging Exam ended

## 2024-07-07 NOTE — Progress Notes (Unsigned)
 [facility]   MULTIDISCIPLINARY HEART VALVE CLINIC                                     Cardiology Office Note:    Date:  07/08/2024   ID:  James Avery, DOB 1934/01/23, MRN [id number]  PCP:  James Debby HERO, MD  [facility] Cardiologist:  James MARLA Red, MD  [facility] Structural heart: James MARLA Red, MD [facility] Electrophysiologist:  None   Referring MD: James Debby HERO, MD   1 month s/p TAVR  History of Present Illness:    James Avery is a 89 y.o. male with a hx of  RBBB, CKD stage II, DMT2, and severe AS s/p TAVR (05/21/24) who presents to clinic for follow up.   He presented with a history of gradually increasing fatigue over the past couple years and then had an episode of syncope over Memorial weekend 2024 while walking up stairs. Outside echo 03/01/24 showed EF 60% and severe AS with a mean grad 41 mmHg & AVA 0.83 cm2. [facility] 03/14/24 showed showed mild to moderate nonobstructive coronary disease. S/p successful TAVR with a 23 mm Edwards Sapien 3 Ultra Resilia THV via the TF approach on 05/21/24. Post operative echo showed EF >75%, normally functioning TAVR with a mean gradient of 12 mmHg and no PVL. Given underlying RBBB and new LAFB, he was discharged with a Zio AT which did not show any HAVB. Did have one episode of dizziness after TAVR. BP was also noted to be elevated and started on lisinopril . Follow up BMET WNL, but potassium at 5.  Today the patient presents to clinic for follow up.  Had a mechanical fall on lawnmower. He can tell a difference in energy and exercise tolerance since TAVR. Working with CRP phase II in [place]. No CP or SOB. No LE edema, orthopnea or PND. No dizziness or syncope. No blood in stool or urine. No palpitations.     Past Medical History:  Diagnosis Date   Arthritis    Diabetes mellitus without complication (HCC)    Pneumonia    RBBB    S/P TAVR (transcatheter aortic valve replacement) 05/21/2024   s/p TAVR with a  23 mm Edwards S3UR via the TF approach by Dr. Red and Dr Lucas   Severe aortic stenosis      Current Medications: Current Meds  Medication Sig   amoxicillin  (AMOXIL ) 500 MG tablet Take 4 tablets (2,000 mg total) by mouth as directed. 1 hour prior to dental work including cleanings   aspirin  EC 81 MG tablet Take 1 tablet (81 mg total) by mouth daily. Swallow whole. (Patient taking differently: Take 81 mg by mouth in the morning. Swallow whole.)   cyanocobalamin (VITAMIN B12) 1000 MCG tablet Take 1,000 mcg by mouth in the morning.   ezetimibe  (ZETIA ) 10 MG tablet Take 10 mg by mouth in the morning.   lisinopril  (ZESTRIL ) 5 MG tablet Take 1 tablet (5 mg total) by mouth daily.   metFORMIN (GLUCOPHAGE-XR) 500 MG 24 hr tablet Take 1,000 mg by mouth 2 (two) times daily with a meal.   Multiple Vitamins-Minerals (MULTIVITAMIN WITH MINERALS) tablet Take 1 tablet by mouth in the morning.   trimethoprim-polymyxin b (POLYTRIM) ophthalmic solution Place 1 drop into the left eye See admin instructions. Receives eye injection about every 5-6 weeks, uses eye drops after procedure for that day  ROS:   Please see the history of present illness.    All other systems reviewed and are negative.  EKGs       Risk Assessment/Calculations:           Physical Exam:    VS:  BP 122/60 (BP Location: Right Arm)   Pulse 80   Ht 5' 4 (1.626 m)   Wt 155 lb (70.3 kg)   SpO2 96%   BMI 26.61 kg/m     Wt Readings from Last 3 Encounters:  07/08/24 155 lb (70.3 kg)  05/27/24 157 lb 3.2 oz (71.3 kg)  05/22/24 155 lb 11.2 oz (70.6 kg)     GEN: Well nourished, well developed in no acute distress NECK: No JVD CARDIAC: RRR, 1/6 flow murmur @ RUSB. No rubs, gallops RESPIRATORY:  Clear to auscultation without rales, wheezing or rhonchi  ABDOMEN: Soft, non-tender, non-distended EXTREMITIES:  No edema; No deformity.    ASSESSMENT:    1. S/P TAVR (transcatheter aortic valve replacement)   2.  RBBB   3. Thyroid  nodule   4. Essential hypertension   5. Dizziness, nonspecific      PLAN:    In order of problems listed above:  Severe AS s/p TAVR:  -- Echo today shows EF 60%, normally functioning TAVR with a mean gradient of 13 mm hg and mild PVL.  -- NYHA class I symptoms.  -- SBE prophylaxis discussed; I have RX'd amoxicillin .  -- Continue Asprin 81mg  daily.  -- I will see back for 1 year echo and OV.   RBBB: -- Pre existing RBBB and new LAFB after TAVR. -- 14 day Zio with no HAVB.   Thyroid  nodule: -- A 6 mm thyroid  nodule was noted on pre TAVR CT. -- PCP arranged a follow up test which was reassuring.   HTN:  -- This was a new diagnosis for him after TAVR.  -- Started on Lisinpril 5mg  daily last visit and BP now well controlled.   Dizziness: -- Improved.    Medication Adjustments/Labs and Tests Ordered: Current medicines are reviewed at length with the patient today.  Concerns regarding medicines are outlined above.  Orders Placed This Encounter  Procedures   ECHOCARDIOGRAM COMPLETE   No orders of the defined types were placed in this encounter.   Patient Instructions  Medication Instructions:  Your physician recommends that you continue on your current medications as directed. Please refer to the Current Medication list given to you today.  *If you need a refill on your cardiac medications before your next appointment, please call your pharmacy*  Lab Work: None ordered  If you have labs (blood work) drawn today and your tests are completely normal, you will receive your results only by: MyChart Message (if you have MyChart) OR A paper copy in the mail If you have any lab test that is abnormal or we need to change your treatment, we will call you to review the results.  Testing/Procedures: None ordered  Follow-Up: At [facility], you and your health needs are our priority.  As part of our continuing mission to provide you with  exceptional heart care, our providers are all part of one team.  This team includes your primary Cardiologist (physician) and Advanced Practice Providers or APPs (Physician Assistants and Nurse Practitioners) who all work together to provide you with the care you need, when you need it.  Your next appointment:   As scheduled   Provider:   Arun K Thukkani, MD  Izetta Hummer, PA-C    We recommend signing up for the patient portal called MyChart.  Sign up information is provided on this After Visit Summary.  MyChart is used to connect with patients for Virtual Visits (Telemedicine).  Patients are able to view lab/test results, encounter notes, upcoming appointments, etc.  Non-urgent messages can be sent to your provider as well.   To learn more about what you can do with MyChart, go to [email or web address].   Other Instructions        Signed, Lamarr Hummer, PA-C  07/08/2024 3:46 PM    [facility]

## 2024-07-08 ENCOUNTER — Ambulatory Visit: Payer: Self-pay | Admitting: Physician Assistant

## 2024-07-08 ENCOUNTER — Ambulatory Visit: Admitting: Physician Assistant

## 2024-07-08 ENCOUNTER — Other Ambulatory Visit (HOSPITAL_COMMUNITY): Payer: Self-pay | Admitting: Physician Assistant

## 2024-07-08 ENCOUNTER — Ambulatory Visit
Admission: RE | Admit: 2024-07-08 | Discharge: 2024-07-08 | Disposition: A | Discharge status: Home / Self Care | Source: Ambulatory Visit | Attending: Cardiology | Admitting: Cardiology

## 2024-07-08 VITALS — BP 122/60 | HR 80 | Ht 64.0 in | Wt 155.0 lb

## 2024-07-08 DIAGNOSIS — I4510 Unspecified right bundle-branch block: Secondary | ICD-10-CM | POA: Insufficient documentation

## 2024-07-08 DIAGNOSIS — I10 Essential (primary) hypertension: Secondary | ICD-10-CM | POA: Insufficient documentation

## 2024-07-08 DIAGNOSIS — R42 Dizziness and giddiness: Secondary | ICD-10-CM

## 2024-07-08 DIAGNOSIS — E041 Nontoxic single thyroid nodule: Secondary | ICD-10-CM | POA: Insufficient documentation

## 2024-07-08 DIAGNOSIS — Z952 Presence of prosthetic heart valve: Secondary | ICD-10-CM | POA: Insufficient documentation

## 2024-07-08 LAB — ECHOCARDIOGRAM COMPLETE
AR max vel: 2.38 cm2
AV Area VTI: 2.39 cm2
AV Area mean vel: 2.18 cm2
AV Mean grad: 13 mmHg
AV Peak grad: 23.6 mmHg
Ao pk vel: 2.43 m/s
Area-P 1/2: 3.27 cm2
S' Lateral: 2.9 cm

## 2024-07-08 NOTE — Patient Instructions (Signed)
 Medication Instructions:  Your physician recommends that you continue on your current medications as directed. Please refer to the Current Medication list given to you today.  *If you need a refill on your cardiac medications before your next appointment, please call your pharmacy*  Lab Work: None ordered  If you have labs (blood work) drawn today and your tests are completely normal, you will receive your results only by: MyChart Message (if you have MyChart) OR A paper copy in the mail If you have any lab test that is abnormal or we need to change your treatment, we will call you to review the results.  Testing/Procedures: None ordered  Follow-Up: At [facility], you and your health needs are our priority.  As part of our continuing mission to provide you with exceptional heart care, our providers are all part of one team.  This team includes your primary Cardiologist (physician) and Advanced Practice Providers or APPs (Physician Assistants and Nurse Practitioners) who all work together to provide you with the care you need, when you need it.  Your next appointment:   As scheduled   Provider:   Arun K Thukkani, MD Izetta Hummer, PA-C    We recommend signing up for the patient portal called MyChart.  Sign up information is provided on this After Visit Summary.  MyChart is used to connect with patients for Virtual Visits (Telemedicine).  Patients are able to view lab/test results, encounter notes, upcoming appointments, etc.  Non-urgent messages can be sent to your provider as well.   To learn more about what you can do with MyChart, go to [email or web address].   Other Instructions

## 2024-07-11 ENCOUNTER — Encounter (INDEPENDENT_AMBULATORY_CARE_PROVIDER_SITE_OTHER): Admitting: Ophthalmology

## 2024-07-11 DIAGNOSIS — H43813 Vitreous degeneration, bilateral: Secondary | ICD-10-CM | POA: Diagnosis not present

## 2024-07-11 DIAGNOSIS — E113291 Type 2 diabetes mellitus with mild nonproliferative diabetic retinopathy without macular edema, right eye: Secondary | ICD-10-CM

## 2024-07-11 DIAGNOSIS — Z7984 Long term (current) use of oral hypoglycemic drugs: Secondary | ICD-10-CM

## 2024-07-11 DIAGNOSIS — H34812 Central retinal vein occlusion, left eye, with macular edema: Secondary | ICD-10-CM | POA: Diagnosis not present

## 2024-08-22 ENCOUNTER — Encounter (INDEPENDENT_AMBULATORY_CARE_PROVIDER_SITE_OTHER): Admitting: Ophthalmology

## 2024-08-22 DIAGNOSIS — H43813 Vitreous degeneration, bilateral: Secondary | ICD-10-CM

## 2024-08-22 DIAGNOSIS — Z7984 Long term (current) use of oral hypoglycemic drugs: Secondary | ICD-10-CM

## 2024-08-22 DIAGNOSIS — E113291 Type 2 diabetes mellitus with mild nonproliferative diabetic retinopathy without macular edema, right eye: Secondary | ICD-10-CM | POA: Diagnosis not present

## 2024-08-22 DIAGNOSIS — H35033 Hypertensive retinopathy, bilateral: Secondary | ICD-10-CM

## 2024-08-22 DIAGNOSIS — H34812 Central retinal vein occlusion, left eye, with macular edema: Secondary | ICD-10-CM

## 2024-08-22 DIAGNOSIS — I10 Essential (primary) hypertension: Secondary | ICD-10-CM

## 2024-10-03 ENCOUNTER — Encounter (INDEPENDENT_AMBULATORY_CARE_PROVIDER_SITE_OTHER): Admitting: Ophthalmology

## 2024-10-03 DIAGNOSIS — Z794 Long term (current) use of insulin: Secondary | ICD-10-CM | POA: Diagnosis not present

## 2024-10-03 DIAGNOSIS — H34812 Central retinal vein occlusion, left eye, with macular edema: Secondary | ICD-10-CM | POA: Diagnosis not present

## 2024-10-03 DIAGNOSIS — H43813 Vitreous degeneration, bilateral: Secondary | ICD-10-CM

## 2024-10-03 DIAGNOSIS — E113391 Type 2 diabetes mellitus with moderate nonproliferative diabetic retinopathy without macular edema, right eye: Secondary | ICD-10-CM | POA: Diagnosis not present

## 2024-10-26 NOTE — Progress Notes (Unsigned)
 Patient ID: James Avery MRN: [id number] DOB/AGE: 06/17/1934 89 y.o.  Primary Care Physician:Whyte, Debby HERO, MD Primary Cardiologist: Wendel  CC:  Aortic valvular disease management     FOCUSED PROBLEM LIST:   AS Status post TAVR 23 mm SAPIEN 07 May 2024 Complicated by new bifascicular block CAD 40% M LAD coronary angiography 2025 T2DM Not on insulin  Hyperlipidemia Aortic atherosclerosis Chest CT 2025 CKD II RBBB BMI 31 March 2024:   The patient is a 89 year old male with the above listed medical problems here for recommendations regarding severe aortic stenosis.  The patient was seen by his PCP recently.  He had noted gradually increasing fatigue over the last few years yet denied chest tightness, presyncope, or syncope.  The patient is here with his wife.  He had a worrisome episode of syncope around [facility].  He was at camp [place].  It was not a hot day.  He was supposed to walk up some stairs and he does not remember what happened but he was found on the ground.  He is evaluated.  He did not suffer head bleed.  He was ultimately discharged home.  He was seen in follow-up by his PCP who ordered an echocardiogram which demonstrated severe aortic stenosis.  For this reason he was referred for further recommendations.  The patient is known he has had a murmur for about 10 or 15 years.  However over the last year or so he has noted increasing fatigue.  He is normally quite an active person especially in the summer.  He fortunately has not had any recurrent presyncope, syncope exertional chest discomfort, or severe shortness of breath.  He denies any paroxysmal nocturnal dyspnea, orthopnea, or signs or symptoms of stroke.  He has not required any emergency room visits or hospitalizations recently.  He indicates very good dental health.  Plan: Refer for TAVR.  November 2025:  Patient consents to use of AI scribe. In the interim the patient underwent TAVR with a 23 mm  SAPIEN 3 valve in June that was complicated by new bifascicular block.  He was seen in August for follow-up.  His EKG at that point in time demonstrated a right bundle branch block.  Echocardiogram demonstrated normally functioning TAVR valve with mean gradient 13 mmHg and mild PVL.         Past Medical History:  Diagnosis Date   Arthritis    Diabetes mellitus without complication (HCC)    Pneumonia    RBBB    S/P TAVR (transcatheter aortic valve replacement) 05/21/2024   s/p TAVR with a 23 mm Edwards S3UR via the TF approach by Dr. Wendel and Dr Lucas   Severe aortic stenosis     Past Surgical History:  Procedure Laterality Date   APPENDECTOMY     CATARACT EXTRACTION     HERNIA REPAIR     INTRAOPERATIVE TRANSTHORACIC ECHOCARDIOGRAM N/A 05/21/2024   Procedure: ECHOCARDIOGRAM, TRANSTHORACIC;  Surgeon: Wendel Lurena POUR, MD;  Location: [facility];  Service: Cardiovascular;  Laterality: N/A;   LASIK     PELVIC ANGIOGRAPHY  03/14/2024   Procedure: PELVIC ANGIOGRAPHY;  Surgeon: Wendel Lurena POUR, MD;  Location: [facility];  Service: Cardiovascular;;   RIGHT HEART CATH AND CORONARY ANGIOGRAPHY N/A 03/14/2024   Procedure: RIGHT HEART CATH AND CORONARY ANGIOGRAPHY;  Surgeon: Wendel Lurena POUR, MD;  Location: [facility];  Service: Cardiovascular;  Laterality: N/A;   TONSILLECTOMY  No family history on file.  Social History   Socioeconomic History   Marital status: Married    Spouse name: Not on file   Number of children: Not on file   Years of education: Not on file   Highest education level: Not on file  Occupational History   Not on file  Tobacco Use   Smoking status: Never   Smokeless tobacco: Never  Vaping Use   Vaping status: Never Used  Substance and Sexual Activity   Alcohol use: Not Currently   Drug use: Not Currently   Sexual activity: Not on file  Other Topics Concern   Not on file  Social History Narrative   Not on file   Social  Drivers of Health   Financial Resource Strain: Not on file  Food Insecurity: Low Risk  (12/14/2023)   Received from [facility]   Hunger Vital Sign    Within the past 12 months, you worried that your food would run out before you got money to buy more: Never true    Within the past 12 months, the food you bought just didn't last and you didn't have money to get more. : Never true  Transportation Needs: No Transportation Needs (12/14/2023)   Received from [facility]    In the past 12 months, has lack of reliable transportation kept you from medical appointments, meetings, work or from getting things needed for daily living? : No  Physical Activity: Not on file  Stress: Not on file  Social Connections: Not on file  Intimate Partner Violence: Not on file     Prior to Admission medications   Medication Sig Start Date End Date Taking? Authorizing Provider  glimepiride (AMARYL) 1 MG tablet  03/19/17   [provider]  ibuprofen  (ADVIL ) 800 MG tablet Take 1 tablet (800 mg total) by mouth every 6 (six) hours as needed. 05/10/21   Tobie Franky SQUIBB, DPM  metFORMIN (GLUCOPHAGE-XR) 500 MG 24 hr tablet  03/19/17   [provider]  oxyCODONE -acetaminophen  (PERCOCET) 5-325 MG tablet Take 1-2 tablets by mouth every 4 (four) hours as needed for severe pain. 05/10/21   Tobie Franky SQUIBB, DPM    Allergies  Allergen Reactions   Demerol [Meperidine Hcl]     Does not remember reaction    Fluorescein  Nausea And Vomiting    Retinal dye injection    Pravastatin Sodium     Does not remember reaction    Septra [Sulfamethoxazole-Trimethoprim]     Does not remember reaction     REVIEW OF SYSTEMS:  General: no fevers/chills/night sweats Eyes: no blurry vision, diplopia, or amaurosis ENT: no sore throat or hearing loss Resp: no cough, wheezing, or hemoptysis CV: no edema or palpitations GI: no abdominal pain, nausea, vomiting, diarrhea, or constipation GU: no dysuria,  frequency, or hematuria Skin: no rash Neuro: no headache, numbness, tingling, or weakness of extremities Musculoskeletal: no joint pain or swelling Heme: no bleeding, DVT, or easy bruising Endo: no polydipsia or polyuria  There were no vitals taken for this visit.  PHYSICAL EXAM: GEN:  AO x 3 in no acute distress HEENT: normal Dentition: Normal Neck: JVP normal. +2 carotid upstrokes without bruits. No thyromegaly. Lungs: equal expansion, clear bilaterally CV: Apex is discrete and nondisplaced, RRR with 3/6 SEM Abd: soft, non-tender, non-distended; no bruit; positive bowel sounds Ext: no edema, ecchymoses, or cyanosis Vascular: 2+ femoral pulses, 2+ radial pulses       Skin: warm and dry  without rash Neuro: CN II-XII grossly intact; motor and sensory grossly intact    DATA AND STUDIES:  EKG: 2025 normal sinus rhythm right bundle branch block  EKG Interpretation Date/Time:    Ventricular Rate:    PR Interval:    QRS Duration:    QT Interval:    QTC Calculation:   R Axis:      Text Interpretation:          Cardiac Studies & Procedures   ______________________________________________________________________________________________ CARDIAC CATHETERIZATION  CARDIAC CATHETERIZATION 03/14/2024  Conclusion   Mid LAD lesion is 40% stenosed.  1.  Mild to moderate nonobstructive coronary artery disease of right dominant system. 2.  Fick cardiac output of 7.0 L/min and Fick cardiac index of 4.0 L/min/m with the following hemodynamics: Right atrial pressure mean of 13 mmHg Right ventricular pressure 48/8 with an end-diastolic pressure of 13 mmHg PA pressure 46/17 with a mean PA pressure of 29 mmHg Mean wedge pressure of 13 mmHg with V waves to 20 mmHg PVR of 2.3 Woods units PA pulsatility index of 2.2 3.  Capacious iliofemoral vessels bilaterally.  Recommendation: Continue evaluation for aortic valve intervention.  Findings Coronary Findings Diagnostic  Dominance:  Right  Left Anterior Descending The vessel exhibits minimal luminal irregularities. Mid LAD lesion is 40% stenosed.  Left Circumflex The vessel exhibits minimal luminal irregularities.  Right Coronary Artery The vessel exhibits minimal luminal irregularities.  Intervention  No interventions have been documented.     ECHOCARDIOGRAM  ECHOCARDIOGRAM COMPLETE 07/08/2024  Narrative ECHOCARDIOGRAM REPORT    Patient Name:   James Avery Date of Exam: 07/08/2024 Medical Rec #:  [id number]      Height:       64.0 in Accession #:    [id number]     Weight:       157.2 lb Date of Birth:  12-Jan-1934      BSA:          1.766 m Patient Age:    89 years       BP:           122/60 mmHg Patient Gender: M              HR:           76 bpm. Exam Location:  [address]  Procedure: 2D Echo, Cardiac Doppler, Color Doppler and 3D Echo (Both Spectral and Color Flow Doppler were utilized during procedure).  Indications:    Post TAVR evaluation Z95.2  History:        Patient has prior history of Echocardiogram examinations, most recent 05/22/2024. Arrythmias:RBBB; Risk Factors:Diabetes. Aortic Valve: 23 mm Edwards Sapien prosthetic, stented (TAVR) valve is present in the aortic position. Procedure Date: 05/21/2024.  Sonographer:    Augustin Seals RDCS Referring Phys: KATHRYN R THOMPSON  IMPRESSIONS   1. Left ventricular ejection fraction, by estimation, is 60 to 65%. Left ventricular ejection fraction by 3D volume is 60 %. The left ventricle has normal function. The left ventricle has no regional wall motion abnormalities. Left ventricular diastolic parameters are consistent with Grade I diastolic dysfunction (impaired relaxation). 2. Right ventricular systolic function is normal. The right ventricular size is normal. Tricuspid regurgitation signal is inadequate for assessing PA pressure. 3. The mitral valve is normal in structure. No evidence of mitral valve regurgitation. No evidence of  mitral stenosis. 4. The aortic valve has been repaired/replaced. Mild perivalvular Aortic valve regurgitation is present. No aortic stenosis is present. There is a 23 mm  Edwards Sapien prosthetic (TAVR) valve present in the aortic position. Procedure Date: 05/21/2024. Aortic valve area, by VTI measures 2.39 cm. Aortic valve mean gradient measures 13.0 mmHg. Aortic valve Vmax measures 2.43 m/s. 5. The inferior vena cava is normal in size with greater than 50% respiratory variability, suggesting right atrial pressure of 3 mmHg. 6. Compared to study dated 05/22/2024, the mean TAVR gradient is stable, VMax has decreased from 2.5 to 2.89m/s and DI has decreased from 0.68 to 0.63. Normal functioning TAVR with mild perivalvular AI.  FINDINGS Left Ventricle: Left ventricular ejection fraction, by estimation, is 60 to 65%. Left ventricular ejection fraction by 3D volume is 60 %. The left ventricle has normal function. The left ventricle has no regional wall motion abnormalities. The left ventricular internal cavity size was normal in size. There is no left ventricular hypertrophy. Left ventricular diastolic parameters are consistent with Grade I diastolic dysfunction (impaired relaxation). Normal left ventricular filling pressure.  Right Ventricle: The right ventricular size is normal. No increase in right ventricular wall thickness. Right ventricular systolic function is normal. Tricuspid regurgitation signal is inadequate for assessing PA pressure.  Left Atrium: Left atrial size was normal in size.  Right Atrium: Right atrial size was normal in size.  Pericardium: There is no evidence of pericardial effusion.  Mitral Valve: The mitral valve is normal in structure. Mild mitral annular calcification. No evidence of mitral valve regurgitation. No evidence of mitral valve stenosis.  Tricuspid Valve: The tricuspid valve is normal in structure. Tricuspid valve regurgitation is trivial. No evidence of tricuspid  stenosis.  Aortic Valve: The aortic valve has been repaired/replaced. Aortic valve regurgitation is mild. No aortic stenosis is present. Aortic valve mean gradient measures 13.0 mmHg. Aortic valve peak gradient measures 23.6 mmHg. Aortic valve area, by VTI measures 2.39 cm. There is a 23 mm Edwards Sapien prosthetic, stented (TAVR) valve present in the aortic position. Procedure Date: 05/21/2024.  Pulmonic Valve: The pulmonic valve was normal in structure. Pulmonic valve regurgitation is trivial. No evidence of pulmonic stenosis.  Aorta: The aortic root is normal in size and structure.  Venous: The inferior vena cava is normal in size with greater than 50% respiratory variability, suggesting right atrial pressure of 3 mmHg.  IAS/Shunts: No atrial level shunt detected by color flow Doppler.  Additional Comments: 3D was performed not requiring image post processing on an independent workstation and was normal.   LEFT VENTRICLE PLAX 2D LVIDd:         4.60 cm         Diastology LVIDs:         2.90 cm         LV e' medial:    6.74 cm/s LV PW:         1.00 cm         LV E/e' medial:  12.8 LV IVS:        1.00 cm         LV e' lateral:   8.38 cm/s LVOT diam:     2.20 cm         LV E/e' lateral: 10.3 LV SV:         113 LV SV Index:   64 LVOT Area:     3.80 cm        3D Volume EF LV 3D EF:    Left ventricul ar ejection fraction by 3D volume is 60 %.  3D Volume EF: 3D EF:  60 % LV EDV:       150 ml LV ESV:       60 ml LV SV:        91 ml  RIGHT VENTRICLE             IVC RV Basal diam:  3.20 cm     IVC diam: 1.80 cm RV Mid diam:    2.80 cm RV S prime:     11.40 cm/s TAPSE (M-mode): 1.8 cm  LEFT ATRIUM             Index        RIGHT ATRIUM           Index LA diam:        3.80 cm 2.15 cm/m   RA Area:     14.30 cm LA Vol (A2C):   32.2 ml 18.23 ml/m  RA Volume:   29.30 ml  16.59 ml/m LA Vol (A4C):   38.1 ml 21.57 ml/m LA Biplane Vol: 36.8 ml 20.84 ml/m AORTIC  VALVE AV Area (Vmax):    2.38 cm AV Area (Vmean):   2.18 cm AV Area (VTI):     2.39 cm AV Vmax:           243.00 cm/s AV Vmean:          171.000 cm/s AV VTI:            0.472 m AV Peak Grad:      23.6 mmHg AV Mean Grad:      13.0 mmHg LVOT Vmax:         152.00 cm/s LVOT Vmean:        97.900 cm/s LVOT VTI:          0.297 m LVOT/AV VTI ratio: 0.63  AORTA Ao Root diam: 3.40 cm Ao Asc diam:  3.10 cm  MITRAL VALVE MV Area (PHT): 3.27 cm     SHUNTS MV Decel Time: 232 msec     Systemic VTI:  0.30 m MV E velocity: 86.50 cm/s   Systemic Diam: 2.20 cm MV A velocity: 133.00 cm/s MV E/A ratio:  0.65  Wilbert Bihari MD Electronically signed by Wilbert Bihari MD Signature Date/Time: 07/08/2024/1:54:34 PM    Final    MONITORS  LONG TERM MONITOR-LIVE TELEMETRY (3-14 DAYS) 06/14/2024  Narrative Patch Wear Time:  12 days and 21 hours (2025-06-18T10:33:16-0400 to 2025-07-01T08:03:45-0400)  Patient had a min HR of 46 bpm, max HR of 197 bpm, and avg HR of 74 bpm. Predominant underlying rhythm was Sinus Rhythm.  EVENTS: -1 run of Ventricular Tachycardia occurred lasting 4 beats with a max rate of 145 bpm (avg 118 bpm).  -1 run of Supraventricular Tachycardia occurred lasting 4 beats with a max rate of 197 bpm (avg 174 bpm).  -Isolated SVEs were rare (<1.0%), SVE Couplets were rare (<1.0%), and no SVE Triplets were present.  -Isolated VEs were rare (<1.0%), and no VE Couplets or VE Triplets were present.  -No atrial fibrillation, sustained ventricular tachyarrhythmias, or bradyarrhythmias were detected.  -Patient-triggered events corresponded with sinus rhythm.  SUMMARY:  Reassuring monitor without evidence of heart block with rare NSVT, PACs, and PVCs.   CT SCANS  CT CORONARY MORPH W/CTA COR W/SCORE 04/02/2024  Addendum 04/16/2024  5:47 PM ADDENDUM REPORT: 04/16/2024 17:44  EXAM: OVER-READ INTERPRETATION  CT CHEST  The following report is an over-read performed by  radiologist Dr. Andrea Gasman of [facility] on 04/16/2024. This over-read does not include interpretation  of cardiac or coronary anatomy or pathology. The coronary CTA interpretation by the cardiologist is attached.  COMPARISON:  Concurrent full field of view chest CTA, reported separately.  FINDINGS: Aortic atherosclerosis. No acute findings in the included portion of the thorax. Please reference same day full field of view chest CT for detailed assessment.  IMPRESSION: Aortic Atherosclerosis (ICD10-I70.0).   Electronically Signed By: Andrea Gasman M.D. On: 04/16/2024 17:44  Narrative CLINICAL DATA:  48M with severe aortic stenosis being evaluated for a TAVR procedure.  EXAM: Cardiac TAVR CT  TECHNIQUE: A non-contrast, gated CT scan was obtained with axial slices of 2.5 mm through the heart for aortic valve scoring. A 120 kV retrospective, gated, contrast cardiac scan was obtained. Gantry rotation speed was 230 msec and collimation was 0.63 mm. Nitroglycerin  was not given. A delayed scan was obtained to exclude left atrial appendage thrombus. The 3D dataset was reconstructed in systole with motion correction. The 3D data set was reconstructed in 5% intervals of the 0-95% of the R-R cycle. Systolic and diastolic phases were analyzed on a dedicated workstation using MPR, MIP, and VRT modes. The patient received 100 cc of contrast.  FINDINGS: Aortic Root:  Aortic valve: trileaflet  Aortic valve calcium score: 2021  Aortic annulus:  Diameter: 26mm x 21mm  Perimeter: 74mm  Area: 422 mm^2  Calcifications: No calcifications  Coronary height: Min Left - 13mm; Min Right - 13mm  Sinotubular height: Left cusp - 23mm; Right cusp - 21mm; Noncoronary cusp - 22mm  LVOT (as measured 3 mm below the annulus):  Diameter: 28mm x 21mm  Area: 469mm^2  Calcifications: No calcifications  Aortic sinus width: Left cusp - 36mm; Right cusp - 33mm;  Noncoronary cusp - 32mm  Sinotubular junction width: 27mm x 26mm  Optimum Fluoroscopic Angle for Delivery: LAO 2 CRA 3  Cardiac:  Right atrium: Normal size  Right ventricle: Normal size  Pulmonary arteries: Normal size  Pulmonary veins: Normal configuration  Left atrium: Normal size  Left ventricle: Normal size  Pericardium: Normal thickness  Coronary arteries: Normal origins. Coronary calcium score 454 (percentile not available for age>84)  IMPRESSION: 1. Tricuspid aortic valve with severe calcifications (AV calcium score 2021)  2. Aortic annulus measures 26mm x 21mm in diameter with perimeter 74mm and area 422 mm^2. No annular or LVOT calcifications. Annular measurements are suitable for delivery of 23mm Edwards Sapien 3 valve  3. Sufficient coronary to annulus distance  4. Optimum Fluoroscopic Angle for Delivery:  LAO 2 CRA 3  5. Coronary calcium score 454 (percentile not available for age>84)  Electronically Signed: By: Lonni Nanas M.D. On: 04/02/2024 21:05     ______________________________________________________________________________________________      05/17/2024: ALT 32 05/22/2024: Hemoglobin 12.5; Magnesium  1.8; Platelets 303 06/12/2024: BUN 16; Creatinine, Ser 1.00; Potassium 5.0; Sodium 137      ASSESSMENT AND PLAN:   1. S/P TAVR (transcatheter aortic valve replacement)   2. Coronary artery disease involving native coronary artery of native heart without angina pectoris   3. Type 2 diabetes mellitus without complication, without long-term current use of insulin  (HCC)   4. Hyperlipidemia associated with type 2 diabetes mellitus (HCC)   5. Aortic atherosclerosis   6. Hypertension associated with diabetes (HCC)   7. CKD stage 2 due to type 2 diabetes mellitus (HCC)   8. RBBB      Aortic stenosis: Most recent echocardiogram demonstrated reassuring valve performance and normal ejection fraction.  Continue aspirin  81 mg.   Coronary  artery  disease: Continue aspirin  81 mg, Zetia  10 mg, will defer statin at this point in time given advanced age. T2DM: Continue ASA 81mg , would defer statin in this advanced aged patient.  Continue lisinopril  5 mg, start Jardiance  10 mg *** Hyperlipidemia: Continue Zetia  10 mg will defer statin this advanced age individual.   Aortic atherosclerosis: Continue aspirin  81 mg, Zetia  10 mg  Hypertension: Continue lisinopril  5 mg  CKD II: Continue lisinopril  5 mg, start Jardiance  10 mg *** RBBB: Conduction seems to be improved from bifascicular block to right bundle branch block.  Monitor for now.  I have personally reviewed the patients imaging data as summarized above.  I have reviewed the natural history of aortic stenosis with the patient and family members who are present today. We have discussed the limitations of medical therapy and the poor prognosis associated with symptomatic aortic stenosis. We have also reviewed potential treatment options, including palliative medical therapy, conventional surgical aortic valve replacement, and transcatheter aortic valve replacement. We discussed treatment options in the context of this patient's specific comorbid medical conditions.   All of the patient's questions were answered today. Will make further recommendations based on the results of studies outlined above.   I spent *** minutes reviewing all clinical data during and prior to this visit including all relevant imaging studies, laboratories, clinical information from other health systems and prior notes from both Cardiology and other specialties, interviewing the patient, conducting a complete physical examination, and coordinating care in order to formulate a comprehensive and personalized evaluation and treatment plan.   Esaias Cleavenger K Jennyfer Nickolson, MD  10/26/2024 4:54 PM    [facility] [address] [place], [place], [place]  [address] Phone: [phone]; Fax: [phone]

## 2024-10-30 ENCOUNTER — Ambulatory Visit: Attending: Internal Medicine | Admitting: Internal Medicine

## 2024-10-30 ENCOUNTER — Encounter: Payer: Self-pay | Admitting: Internal Medicine

## 2024-10-30 VITALS — BP 124/58 | HR 98 | Ht 64.0 in | Wt 155.6 lb

## 2024-10-30 DIAGNOSIS — E785 Hyperlipidemia, unspecified: Secondary | ICD-10-CM

## 2024-10-30 DIAGNOSIS — I152 Hypertension secondary to endocrine disorders: Secondary | ICD-10-CM

## 2024-10-30 DIAGNOSIS — I2510 Atherosclerotic heart disease of native coronary artery without angina pectoris: Secondary | ICD-10-CM

## 2024-10-30 DIAGNOSIS — E1169 Type 2 diabetes mellitus with other specified complication: Secondary | ICD-10-CM

## 2024-10-30 DIAGNOSIS — E119 Type 2 diabetes mellitus without complications: Secondary | ICD-10-CM | POA: Diagnosis not present

## 2024-10-30 DIAGNOSIS — Z6827 Body mass index (BMI) 27.0-27.9, adult: Secondary | ICD-10-CM

## 2024-10-30 DIAGNOSIS — I4510 Unspecified right bundle-branch block: Secondary | ICD-10-CM

## 2024-10-30 DIAGNOSIS — E1159 Type 2 diabetes mellitus with other circulatory complications: Secondary | ICD-10-CM

## 2024-10-30 DIAGNOSIS — Z952 Presence of prosthetic heart valve: Secondary | ICD-10-CM | POA: Diagnosis not present

## 2024-10-30 DIAGNOSIS — E1122 Type 2 diabetes mellitus with diabetic chronic kidney disease: Secondary | ICD-10-CM

## 2024-10-30 DIAGNOSIS — I700 Atherosclerosis of aorta: Secondary | ICD-10-CM

## 2024-10-30 DIAGNOSIS — N182 Chronic kidney disease, stage 2 (mild): Secondary | ICD-10-CM

## 2024-10-30 MED ORDER — EMPAGLIFLOZIN 10 MG PO TABS: 10.0000 mg | ORAL_TABLET | Freq: Every day | ORAL | 3 refills | Status: DC

## 2024-10-30 NOTE — Patient Instructions (Signed)
 Medication Instructions:  START Jardiance 10 mg once daily   *If you need a refill on your cardiac medications before your next appointment, please call your pharmacy*  Lab Work: None ordered today. If you have labs (blood work) drawn today and your tests are completely normal, you will receive your results only by: MyChart Message (if you have MyChart) OR A paper copy in the mail If you have any lab test that is abnormal or we need to change your treatment, we will call you to review the results.  Testing/Procedures: None ordered today.  Follow-Up: At [facility], you and your health needs are our priority.  As part of our continuing mission to provide you with exceptional heart care, our providers are all part of one team.  This team includes your primary Cardiologist (physician) and Advanced Practice Providers or APPs (Physician Assistants and Nurse Practitioners) who all work together to provide you with the care you need, when you need it.  Your next appointment:   1 year(s)  Provider:   Lurena Red, MD

## 2024-11-13 ENCOUNTER — Encounter (INDEPENDENT_AMBULATORY_CARE_PROVIDER_SITE_OTHER): Admitting: Ophthalmology

## 2024-11-13 DIAGNOSIS — H34812 Central retinal vein occlusion, left eye, with macular edema: Secondary | ICD-10-CM

## 2024-11-13 DIAGNOSIS — H43813 Vitreous degeneration, bilateral: Secondary | ICD-10-CM

## 2024-11-13 DIAGNOSIS — Z7984 Long term (current) use of oral hypoglycemic drugs: Secondary | ICD-10-CM | POA: Diagnosis not present

## 2024-11-13 DIAGNOSIS — E113291 Type 2 diabetes mellitus with mild nonproliferative diabetic retinopathy without macular edema, right eye: Secondary | ICD-10-CM

## 2024-12-12 ENCOUNTER — Telehealth: Payer: Self-pay | Admitting: Internal Medicine

## 2024-12-12 MED ORDER — EMPAGLIFLOZIN 10 MG PO TABS: 10.0000 mg | ORAL_TABLET | Freq: Every day | ORAL | 3 refills | Status: AC

## 2024-12-12 NOTE — Telephone Encounter (Signed)
" °*  STAT* If patient is at the pharmacy, call can be transferred to refill team.   1. Which medications need to be refilled? (please list name of each medication and dose if known)   empagliflozin  (JARDIANCE ) 10 MG TABS tablet    2. Which pharmacy/location (including street and city if local pharmacy) is medication to be sent to?  [id number] - [place], [place] - [address]    3. Do they need a 30 day or 90 day supply? 90  "

## 2024-12-12 NOTE — Telephone Encounter (Signed)
 Pt's medication was sent to pt's pharmacy as requested. Confirmation received.

## 2024-12-31 ENCOUNTER — Encounter (INDEPENDENT_AMBULATORY_CARE_PROVIDER_SITE_OTHER): Payer: Medicare (Managed Care) | Admitting: Ophthalmology

## 2024-12-31 DIAGNOSIS — H34812 Central retinal vein occlusion, left eye, with macular edema: Secondary | ICD-10-CM | POA: Diagnosis not present

## 2024-12-31 DIAGNOSIS — H43813 Vitreous degeneration, bilateral: Secondary | ICD-10-CM

## 2025-01-01 ENCOUNTER — Encounter (INDEPENDENT_AMBULATORY_CARE_PROVIDER_SITE_OTHER): Payer: Medicare (Managed Care) | Admitting: Ophthalmology

## 2025-02-18 ENCOUNTER — Encounter (INDEPENDENT_AMBULATORY_CARE_PROVIDER_SITE_OTHER): Payer: Medicare (Managed Care) | Admitting: Ophthalmology

## 2025-05-19 ENCOUNTER — Ambulatory Visit: Admitting: Physician Assistant

## 2025-05-19 ENCOUNTER — Other Ambulatory Visit (HOSPITAL_COMMUNITY)
